# Patient Record
Sex: Female | Born: 1995 | Race: White | Hispanic: No | Smoking: Never smoker
Health system: Southern US, Community
[De-identification: ages and names within clinical notes are randomized; demographics above are authoritative.]

## PROBLEM LIST (undated history)

## (undated) ENCOUNTER — Inpatient Hospital Stay: Payer: Self-pay

## (undated) DIAGNOSIS — F419 Anxiety disorder, unspecified: Secondary | ICD-10-CM

## (undated) DIAGNOSIS — F32A Depression, unspecified: Secondary | ICD-10-CM

## (undated) DIAGNOSIS — Z789 Other specified health status: Secondary | ICD-10-CM

## (undated) HISTORY — DX: Anxiety disorder, unspecified: F41.9

## (undated) HISTORY — DX: Depression, unspecified: F32.A

## (undated) HISTORY — PX: DENTAL SURGERY: SHX609

---
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
## 2017-07-27 NOTE — L&D Delivery Note (Signed)
Delivery Note Primary OB: [facility] Delivery Physician: Annamarie Major, MD Gestational Age: Full term Antepartum complications: none Intrapartum complications: None  A viable Female was delivered via vertex perentation.  Apgars:9 ,9  Weight:  pending .   Placenta status: spontaneous and Intact.  Cord: 3+ vessels;  with the following complications: none.  Anesthesia:  none Episiotomy:  none Lacerations:  1st Suture Repair: 2.0 vicryl Est. Blood Loss (mL):  less than 500 mL  Mom to postpartum.  Baby to Couplet care / Skin to Skin.  Annamarie Major, MD, [facility], [facility] 06/02/2018  7:01 PM [phone]

## 2017-10-04 ENCOUNTER — Ambulatory Visit (INDEPENDENT_AMBULATORY_CARE_PROVIDER_SITE_OTHER): Payer: Medicaid Other | Admitting: Obstetrics and Gynecology

## 2017-10-04 ENCOUNTER — Encounter: Payer: Self-pay | Admitting: Obstetrics and Gynecology

## 2017-10-04 VITALS — BP 114/78 | Ht 59.0 in | Wt 150.0 lb

## 2017-10-04 DIAGNOSIS — O99210 Obesity complicating pregnancy, unspecified trimester: Secondary | ICD-10-CM | POA: Insufficient documentation

## 2017-10-04 DIAGNOSIS — Z6830 Body mass index (BMI) 30.0-30.9, adult: Secondary | ICD-10-CM

## 2017-10-04 DIAGNOSIS — Z3A08 8 weeks gestation of pregnancy: Secondary | ICD-10-CM

## 2017-10-04 DIAGNOSIS — O99211 Obesity complicating pregnancy, first trimester: Secondary | ICD-10-CM

## 2017-10-04 DIAGNOSIS — O0990 Supervision of high risk pregnancy, unspecified, unspecified trimester: Secondary | ICD-10-CM

## 2017-10-04 NOTE — Progress Notes (Signed)
New Obstetric Patient H&P   Chief Complaint: "Desires prenatal care"   History of Present Illness: Patient is a 22 y.o. G1P0 female, certain LMP 08/03/17 presents with amenorrhea and positive home pregnancy test. Based on her  LMP, her EDD is Estimated Date of Delivery: 05/10/18 and her EGA is 3479w6d. Cycles are variable in lenght, and occur approximately every : 28 days. She has never had a pap smear.    She had a urine pregnancy test which was positive 6 week(s)  ago. Her last menstrual period was normal and lasted for  about 3 weeks. Since her LMP she claims she has experienced no issues. She had vaginal bleeding about 2 weeks ago, very light. Her past medical history is noncontributory. This is her first pregnancy.   Since her LMP, she admits to the use of tobacco products  Quit at positive pregnancy test She claims she has gained 2 pounds since the start of her pregnancy.  There are cats in the home in the home  no  She admits close contact with children on a regular basis  no  She has had chicken pox in the past no. She did receive the vaccination series.  She has had Tuberculosis exposures, symptoms, or previously tested positive for TB   no Current or past history of domestic violence. no  Genetic Screening/Teratology Counseling: (Includes patient, baby's father, or anyone in either family with:)   1. [facility] 84[facility]  no 2. Thalassemia ([place], [place], Mediterranean, or Asian background): MCV<80  no 3. Neural tube defect (meningomyelocele, spina bifida, anencephaly)  no 4. Congenital heart defect  no  5. Down syndrome  no 6. Tay-Sachs (Jewish, [place])  no 7. Canavan's Disease  no 8. Sickle cell disease or trait (African)  no  9. Hemophilia or other blood disorders  no  10. Muscular dystrophy  no  11. Cystic fibrosis  no  12. Huntington's Chorea  no  13. Mental retardation/autism  Yes (father's brother).   14. Other inherited genetic or chromosomal disorder   no 15. Maternal metabolic disorder (DM, PKU, etc)  no 16. Patient or FOB with a child with a birth defect not listed above no  16a. Patient or FOB with a birth defect themselves no 17. Recurrent pregnancy loss, or stillbirth  no  18. Any medications since LMP other than prenatal vitamins (include vitamins, supplements, OTC meds, drugs, alcohol)  no 19. Any other genetic/environmental exposure to discuss  no  Infection History:   1. Lives with someone with TB or TB exposed  no  2. Patient or partner has history of genital herpes  no 3. Rash or viral illness since LMP  no 4. History of STI (GC, CT, HPV, syphilis, HIV)  no 5. History of recent travel :  no  Other pertinent information:  Yes. Patient with history of anxiety. Not on medication right now and states she is doing well now. She also states she has been told that she is bipolar.  But, nowhere else is that diagnosis listed.  Review of Systems: Review of Systems  Constitutional: Negative.   HENT: Negative.   Eyes: Negative.   Respiratory: Negative.   Cardiovascular: Negative.   Gastrointestinal: Negative.   Genitourinary: Negative.   Musculoskeletal: Negative.   Skin: Negative.   Neurological: Negative.   Psychiatric/Behavioral: Negative.     Past Medical History:  denies  Past Surgical History: denies  Gynecologic History: Patient's last menstrual period was 08/03/2017.  Obstetric History:  G1P0  Family History  Problem Relation Age of Onset  . Diabetes Mellitus II Maternal Aunt     Social History   Socioeconomic History  . Marital status: Single    Spouse name: Not on file  . Number of children: Not on file  . Years of education: Not on file  . Highest education level: Not on file  Social Needs  . Financial resource strain: Not on file  . Food insecurity - worry: Not on file  . Food insecurity - inability: Not on file  . Transportation needs - medical: Not on file  . Transportation needs - non-medical:  Not on file  Occupational History  . Not on file  Tobacco Use  . Smoking status: Never Smoker  . Smokeless tobacco: Never Used  Substance and Sexual Activity  . Alcohol use: No    Frequency: Never  . Drug use: No  . Sexual activity: Yes    Birth control/protection: None  Other Topics Concern  . Not on file  Social History Narrative  . Not on file   Allergies: No Known Allergies  Prior to Admission medications   Medication Sig Start Date End Date Taking? Authorizing Provider  Prenatal Vit-Fe Fumarate-FA (PRENATAL VITAMIN PO) Take by mouth.   Yes [provider]    Physical Exam BP 114/78   Ht 4\' 11"  (1.499 m)   Wt 150 lb (68 kg)   LMP 08/03/2017   BMI 30.30 kg/m   Physical Exam  Constitutional: She is oriented to person, place, and time. She appears well-developed and well-nourished. No distress.  HENT:  Head: Normocephalic and atraumatic.  Eyes: Conjunctivae are normal.  Neck: Normal range of motion. Neck supple. No thyromegaly present.  Cardiovascular: Normal rate, regular rhythm and normal heart sounds. Exam reveals no gallop and no friction rub.  No murmur heard. Pulmonary/Chest: Effort normal and breath sounds normal. She has no wheezes.  Abdominal: Soft. She exhibits no distension and no mass. There is no tenderness. There is no rebound and no guarding. No hernia. Hernia confirmed negative in the right inguinal area and confirmed negative in the left inguinal area.  Genitourinary: Vagina normal and uterus normal. Pelvic exam was performed with patient supine. There is no rash, tenderness or lesion on the right labia. There is no rash, tenderness or lesion on the left labia. Cervix exhibits no motion tenderness. Right adnexum displays no mass, no tenderness and no fullness. Left adnexum displays no mass, no tenderness and no fullness.  Musculoskeletal: Normal range of motion.  Lymphadenopathy:    She has no cervical adenopathy.       Right: No inguinal  adenopathy present.       Left: No inguinal adenopathy present.  Neurological: She is alert and oriented to person, place, and time.  Skin: Skin is warm and dry. No rash noted.  Psychiatric: She has a normal mood and affect. Her behavior is normal. Judgment normal.    Female Chaperone present during breast and/or pelvic exam.  Assessment: 22 y.o. G1P0 at [redacted]w[redacted]d presenting to initiate prenatal care  Plan: 1) Avoid alcoholic beverages. 2) Patient encouraged not to smoke.  3) Discontinue the use of all non-medicinal drugs and chemicals.  4) Take prenatal vitamins daily.  5) Nutrition, food safety (fish, cheese advisories, and high nitrite foods) and exercise discussed. 6) Hospital and practice style discussed with cross coverage system.  7) Genetic Screening, such as with 1st Trimester Screening, cell free fetal DNA, AFP testing, and Ultrasound,  as well as with amniocentesis and CVS as appropriate, is discussed with patient. At the conclusion of today's visit patient undecided genetic testing 8) Patient is asked about travel to areas at risk for the [place] virus, and counseled to avoid travel and exposure to mosquitoes or sexual partners who may have themselves been exposed to the virus. Testing is discussed, and will be ordered as appropriate.  [place] samples given.  Thomasene Mohair, MD 10/04/2017 4:24 PM

## 2017-10-07 LAB — URINE CULTURE

## 2017-10-07 LAB — IGP, CTNG, RFX APTIMA HPV ASCU
Chlamydia, Nuc. Acid Amp: NEGATIVE
Gonococcus by Nucleic Acid Amp: NEGATIVE
PAP Smear Comment: 0

## 2017-10-15 ENCOUNTER — Encounter: Payer: Self-pay | Admitting: Advanced Practice Midwife

## 2017-10-15 ENCOUNTER — Ambulatory Visit (INDEPENDENT_AMBULATORY_CARE_PROVIDER_SITE_OTHER): Payer: Medicaid Other

## 2017-10-15 ENCOUNTER — Other Ambulatory Visit: Payer: Medicaid Other

## 2017-10-15 ENCOUNTER — Other Ambulatory Visit: Payer: Self-pay | Admitting: Obstetrics and Gynecology

## 2017-10-15 ENCOUNTER — Ambulatory Visit (INDEPENDENT_AMBULATORY_CARE_PROVIDER_SITE_OTHER): Payer: Medicaid Other | Admitting: Advanced Practice Midwife

## 2017-10-15 VITALS — BP 118/60 | Wt 149.0 lb

## 2017-10-15 DIAGNOSIS — O0990 Supervision of high risk pregnancy, unspecified, unspecified trimester: Secondary | ICD-10-CM

## 2017-10-15 DIAGNOSIS — Z6830 Body mass index (BMI) 30.0-30.9, adult: Secondary | ICD-10-CM

## 2017-10-15 DIAGNOSIS — O99211 Obesity complicating pregnancy, first trimester: Secondary | ICD-10-CM

## 2017-10-15 DIAGNOSIS — Z3A01 Less than 8 weeks gestation of pregnancy: Secondary | ICD-10-CM

## 2017-10-15 NOTE — Progress Notes (Signed)
No [email or web address] 

## 2017-10-15 NOTE — Progress Notes (Signed)
  Routine Prenatal Care Visit  Subjective  Alyssa Duffy is a 22 y.o. G1P0 at 1763w0d being seen today for ongoing prenatal care.  She is currently monitored for the following issues for this high-risk pregnancy and has Supervision of high risk pregnancy, antepartum; Obesity affecting pregnancy; and BMI 30.0-30.9,adult on their problem list.  ----------------------------------------------------------------------------------- Patient reports no complaints.  Patient is undecided on genetic screening.  . Vag. Bleeding: None.   . Denies leaking of fluid.  ----------------------------------------------------------------------------------- The following portions of the patient's history were reviewed and updated as appropriate: allergies, current medications, past family history, past medical history, past social history, past surgical history and problem list. Problem list updated.   Objective  Blood pressure 118/60, weight 149 lb (67.6 kg), last menstrual period 08/03/2017. Pregravid weight 148 lb (67.1 kg) Total Weight Gain 1 lb (0.454 kg) Urinalysis:      Fetal Status: Fetal Heart Rate (bpm): 132         Dating ultrasound today measures 7663w0d. EDD adjusted due to difference in dates.   General:  Alert, oriented and cooperative. Patient is in no acute distress.  Skin: Skin is warm and dry. No rash noted.   Cardiovascular: Normal heart rate noted  Respiratory: Normal respiratory effort, no problems with respiration noted  Abdomen: Soft, gravid, appropriate for gestational age.       Pelvic:  Cervical exam deferred        Extremities: Normal range of motion.     Mental Status: Normal mood and affect. Normal behavior. Normal judgment and thought content.   Assessment   22 y.o. G1P0 at 5363w0d by  06/03/2018, by Ultrasound presenting for routine prenatal visit  Plan   pregnancy Problems (from 10/04/17 to present)    Problem Noted Resolved   Supervision of high risk pregnancy, antepartum  10/04/2017 by Conard NovakJackson, Stephen D, MD No   Obesity affecting pregnancy 10/04/2017 by Conard NovakJackson, Stephen D, MD No   BMI 30.0-30.9,adult 10/04/2017 by Conard NovakJackson, Stephen D, MD No       Preterm labor symptoms and general obstetric precautions including but not limited to vaginal bleeding, contractions, leaking of fluid and fetal movement were reviewed in detail with the patient.   Return in about 1 month (around 11/12/2017) for rob.  Tresea MallJane Linzey Ramser, CNM 10/15/2017 11:08 AM

## 2017-10-18 LAB — RPR+RH+ABO+RUB AB+AB SCR+CB...
Antibody Screen: NEGATIVE
HEMOGLOBIN: 13.4 g/dL (ref 11.1–15.9)
HEP B S AG: NEGATIVE
HIV SCREEN 4TH GENERATION: NONREACTIVE
Hematocrit: 41 % (ref 34.0–46.6)
MCH: 29.1 pg (ref 26.6–33.0)
MCHC: 32.7 g/dL (ref 31.5–35.7)
MCV: 89 fL (ref 79–97)
Platelets: 264 10*3/uL (ref 150–379)
RBC: 4.61 x10E6/uL (ref 3.77–5.28)
RDW: 14.2 % (ref 12.3–15.4)
RPR Ser Ql: NONREACTIVE
Rh Factor: POSITIVE
Rubella Antibodies, IGG: <0.9 index — ABNORMAL LOW (ref >0.99)
WBC: 9.3 10*3/uL (ref 3.4–10.8)

## 2017-10-18 LAB — GLUCOSE, 1 HOUR GESTATIONAL: GESTATIONAL DIABETES SCREEN: 88 mg/dL (ref 65–139)

## 2017-10-18 LAB — HEMOGLOBINOPATHY EVALUATION
HGB C: 0 %
HGB S: 0 %
HGB VARIANT: 0 %
Hemoglobin A2 Quantitation: 2.5 % (ref 1.8–3.2)
Hemoglobin F Quantitation: 0 % (ref 0.0–2.0)
Hgb A: 97.5 % (ref 96.4–98.8)

## 2017-11-11 ENCOUNTER — Ambulatory Visit (INDEPENDENT_AMBULATORY_CARE_PROVIDER_SITE_OTHER): Payer: Medicaid Other | Admitting: Obstetrics and Gynecology

## 2017-11-11 ENCOUNTER — Encounter: Payer: Self-pay | Admitting: Obstetrics and Gynecology

## 2017-11-11 VITALS — BP 122/70 | Wt 148.0 lb

## 2017-11-11 DIAGNOSIS — O0990 Supervision of high risk pregnancy, unspecified, unspecified trimester: Secondary | ICD-10-CM

## 2017-11-11 DIAGNOSIS — O99211 Obesity complicating pregnancy, first trimester: Secondary | ICD-10-CM

## 2017-11-11 DIAGNOSIS — Z3A10 10 weeks gestation of pregnancy: Secondary | ICD-10-CM

## 2017-11-11 DIAGNOSIS — Z6830 Body mass index (BMI) 30.0-30.9, adult: Secondary | ICD-10-CM

## 2017-11-11 NOTE — Progress Notes (Incomplete)
  Routine Prenatal Care Visit  Subjective  Alyssa Duffy is a 22 y.o. G1P0 at 5926w6d being seen today for ongoing prenatal care.  She is currently monitored for the following issues for this {Blank single:19197::"high-risk","low-risk"} pregnancy and has Supervision of high risk pregnancy, antepartum; Obesity affecting pregnancy; and BMI 30.0-30.9,adult on their problem list.  ----------------------------------------------------------------------------------- Patient reports {sx:14538}.    . Vag. Bleeding: None.   . Denies leaking of fluid.  ----------------------------------------------------------------------------------- The following portions of the patient's history were reviewed and updated as appropriate: allergies, current medications, past family history, past medical history, past social history, past surgical history and problem list. Problem list updated.   Objective  Blood pressure 122/70, weight 148 lb (67.1 kg), last menstrual period 08/03/2017. Pregravid weight 148 lb (67.1 kg) Total Weight Gain 0 lb (0 kg) Urinalysis: Urine Protein: Negative Urine Glucose: Negative  Fetal Status: Fetal Heart Rate (bpm): 170         General:  Alert, oriented and cooperative. Patient is in no acute distress.  Skin: Skin is warm and dry. No rash noted.   Cardiovascular: Normal heart rate noted  Respiratory: Normal respiratory effort, no problems with respiration noted  Abdomen: Soft, gravid, appropriate for gestational age. Pain/Pressure: Absent     Pelvic:  {Blank single:19197::"Cervical exam performed","Cervical exam deferred"}        Extremities: Normal range of motion.     Mental Status: Normal mood and affect. Normal behavior. Normal judgment and thought content.   Assessment   22 y.o. G1P0 at 5726w6d by  06/03/2018, by Ultrasound presenting for {Blank single:19197::"routine","work-in"} prenatal visit  Plan   pregnancy Problems (from 10/04/17 to present)    Problem Noted Resolved   Supervision of high risk pregnancy, antepartum 10/04/2017 by Conard NovakJackson, Giuseppe Duchemin D, MD No   Overview Signed 10/19/2017  3:21 PM by Conard NovakJackson, Alyce Inscore D, MD    Clinic [facility] Prenatal Labs  Dating  Blood type:     Genetic Screen 1 Screen:    AFP:     Quad:     NIPS: Antibody:   Anatomic US  Rubella:   Varicella: @VZVIGG @  GTT Early:  8288              Third trimester:  RPR:     Rhogam  HBsAg:     TDaP vaccine                       Flu Shot: HIV:     Baby Food                                GBS:   Contraception  Pap: NILM  CBB   SS: neg  CS/VBAC    Support Person            Obesity affecting pregnancy 10/04/2017 by Conard NovakJackson, Robbert Langlinais D, MD No   BMI 30.0-30.9,adult 10/04/2017 by Conard NovakJackson, Erynn Vaca D, MD No       {Blank single:19197::"Term","Preterm"} labor symptoms and general obstetric precautions including but not limited to vaginal bleeding, contractions, leaking of fluid and fetal movement were reviewed in detail with the patient. Please refer to After Visit Summary for other counseling recommendations.   Return in about 1 week (around 11/18/2017) for U/S for NT and Routine Prenatal Appointment.  Thomasene MohairStephen Sapna Padron, MD, Merlinda FrederickFACOG [facility], [facility] 11/11/2017 9:43 AM  No vb. No lof.

## 2017-11-11 NOTE — Patient Instructions (Signed)
For nausea (these may be purchased over-the-counter): -Vitamin B6 (pyridoxine):  25 mg three times each day (may buy 100 mg tablet and take twice per day or try to cut into 4 equal pieces and take 1 piece three times each day).  - doxylamine (found in [employer] and other sleep agents that can be bought in the store): take 25 - 50 mg at bedtime.  May take up to 25 mg three time each day.  However, keep in mind that this might make you sleepy.  

## 2017-11-11 NOTE — Progress Notes (Signed)
  Routine Prenatal Care Visit  Subjective  Alyssa Duffy is a 22 y.o. G1P0 at 4269w6d being seen today for ongoing prenatal care.  She is currently monitored for the following issues for this low-risk pregnancy and has Supervision of high risk pregnancy, antepartum; Obesity affecting pregnancy; and BMI 30.0-30.9,adult on their problem list.  ----------------------------------------------------------------------------------- Patient reports no complaints.    . Vag. Bleeding: None.   . Denies leaking of fluid.  ----------------------------------------------------------------------------------- The following portions of the patient's history were reviewed and updated as appropriate: allergies, current medications, past family history, past medical history, past social history, past surgical history and problem list. Problem list updated.   Objective  Blood pressure 122/70, weight 148 lb (67.1 kg), last menstrual period 08/03/2017. Pregravid weight 148 lb (67.1 kg) Total Weight Gain 0 lb (0 kg) Urinalysis: Urine Protein: Negative Urine Glucose: Negative  Fetal Status: Fetal Heart Rate (bpm): 170         General:  Alert, oriented and cooperative. Patient is in no acute distress.  Skin: Skin is warm and dry. No rash noted.   Cardiovascular: Normal heart rate noted  Respiratory: Normal respiratory effort, no problems with respiration noted  Abdomen: Soft, gravid, appropriate for gestational age. Pain/Pressure: Absent     Pelvic:  Cervical exam deferred        Extremities: Normal range of motion.     Mental Status: Normal mood and affect. Normal behavior. Normal judgment and thought content.   Assessment   22 y.o. G1P0 at 269w6d by  06/03/2018, by Ultrasound presenting for routine prenatal visit  Plan   pregnancy Problems (from 10/04/17 to present)    Problem Noted Resolved   Supervision of high risk pregnancy, antepartum 10/04/2017 by Conard NovakJackson, Dijon Cosens D, MD No   Overview Signed 10/19/2017   3:21 PM by Conard NovakJackson, Bonifacio Pruden D, MD    [facility]inic [facility] Prenatal Labs  Dating  Blood type:     Genetic Screen 1 Screen:    AFP:     Quad:     NIPS: Antibody:   Anatomic US  Rubella:   Varicella: @VZVIGG @  GTT Early:  7088              Third trimester:  RPR:     Rhogam  HBsAg:     TDaP vaccine                       Flu Shot: HIV:     Baby Food                                GBS:   Contraception  Pap: NILM  CBB   SS: neg  CS/VBAC    Support Person            Obesity affecting pregnancy 10/04/2017 by Conard NovakJackson, Darien Mignogna D, MD No   BMI 30.0-30.9,adult 10/04/2017 by Conard NovakJackson, Shondell Poulson D, MD No       Preterm labor symptoms and general obstetric precautions including but not limited to vaginal bleeding, contractions, leaking of fluid and fetal movement were reviewed in detail with the patient. Please refer to After Visit Summary for other counseling recommendations.   Return in about 1 month (around 12/09/2017) for Routine Prenatal Appointment.  Thomasene MohairStephen Jeffie Widdowson, MD, Merlinda FrederickFACOG [facility], [facility] 11/11/2017 9:45 AM

## 2017-12-09 ENCOUNTER — Ambulatory Visit (INDEPENDENT_AMBULATORY_CARE_PROVIDER_SITE_OTHER): Payer: Medicaid Other | Admitting: Obstetrics and Gynecology

## 2017-12-09 ENCOUNTER — Encounter: Payer: Self-pay | Admitting: Obstetrics and Gynecology

## 2017-12-09 VITALS — BP 110/60 | Wt 145.0 lb

## 2017-12-09 DIAGNOSIS — O99212 Obesity complicating pregnancy, second trimester: Secondary | ICD-10-CM

## 2017-12-09 DIAGNOSIS — Z3A14 14 weeks gestation of pregnancy: Secondary | ICD-10-CM

## 2017-12-09 DIAGNOSIS — Z6830 Body mass index (BMI) 30.0-30.9, adult: Secondary | ICD-10-CM

## 2017-12-09 DIAGNOSIS — O0990 Supervision of high risk pregnancy, unspecified, unspecified trimester: Secondary | ICD-10-CM

## 2017-12-09 NOTE — Progress Notes (Signed)
  Routine Prenatal Care Visit  Subjective  Alyssa Duffy is a 22 y.o. G1P0 at [redacted]w[redacted]d being seen today for ongoing prenatal care.  She is currently monitored for the following issues for this low-risk pregnancy and has Supervision of high risk pregnancy, antepartum; Obesity affecting pregnancy; and BMI 30.0-30.9,adult on their problem list.  ----------------------------------------------------------------------------------- Patient reports no complaints.    . Vag. Bleeding: None.   . Denies leaking of fluid.  ----------------------------------------------------------------------------------- The following portions of the patient's history were reviewed and updated as appropriate: allergies, current medications, past family history, past medical history, past social history, past surgical history and problem list. Problem list updated.   Objective  Blood pressure 110/60, weight 145 lb (65.8 kg), last menstrual period 08/03/2017. Pregravid weight 148 lb (67.1 kg) Total Weight Gain -3 lb (-1.361 kg) Urinalysis: Urine Protein: Negative Urine Glucose: Negative  Fetal Status: Fetal Heart Rate (bpm): 155         General:  Alert, oriented and cooperative. Patient is in no acute distress.  Skin: Skin is warm and dry. No rash noted.   Cardiovascular: Normal heart rate noted  Respiratory: Normal respiratory effort, no problems with respiration noted  Abdomen: Soft, gravid, appropriate for gestational age. Pain/Pressure: Absent     Pelvic:  Cervical exam deferred        Extremities: Normal range of motion.     Mental Status: Normal mood and affect. Normal behavior. Normal judgment and thought content.   Assessment   22 y.o. G1P0 at [redacted]w[redacted]d by  06/03/2018, by Ultrasound presenting for routine prenatal visit  Plan   pregnancy Problems (from 10/04/17 to present)    Problem Noted Resolved   Supervision of high risk pregnancy, antepartum 10/04/2017 by Conard Novak, MD No   Overview Signed  10/19/2017  3:21 PM by Conard Novak, MD    [facility]inic [facility] Prenatal Labs  Dating  Blood type:     Genetic Screen 1 Screen:    AFP:     Quad:     NIPS: Antibody:   Anatomic [place]  Rubella:   Varicella: @  GTT Early:  60              Third trimester:  RPR:     Rhogam  HBsAg:     TDaP vaccine                       Flu Shot: HIV:     Baby Food                                GBS:   Contraception  Pap: NILM  CBB   SS: neg  CS/VBAC    Support Person            Obesity affecting pregnancy 10/04/2017 by Conard Novak, MD No   BMI 30.0-30.9,adult 10/04/2017 by Conard Novak, MD No       Preterm labor symptoms and general obstetric precautions including but not limited to vaginal bleeding, contractions, leaking of fluid and fetal movement were reviewed in detail with the patient. Please refer to After Visit Summary for other counseling recommendations.   -declines genetic screening again today.   Return in about 1 month (around 01/06/2018) for schedule u/s for anatomy and routine prenatal after.  Thomasene Mohair, MD, [facility], [facility] 12/09/2017 11:36 AM

## 2017-12-09 NOTE — Patient Instructions (Signed)
Second Trimester of Pregnancy The second trimester is from week 13 through week 28, month 4 through 6. This is often the time in pregnancy that you feel your best. Often times, morning sickness has lessened or quit. You may have more energy, and you may get hungry more often. Your unborn baby (fetus) is growing rapidly. At the end of the sixth month, he or she is about 9 inches long and weighs about 1 pounds. You will likely feel the baby move (quickening) between 18 and 20 weeks of pregnancy. Follow these instructions at home:  Avoid all smoking, herbs, and alcohol. Avoid drugs not approved by your doctor.  Do not use any tobacco products, including cigarettes, chewing tobacco, and electronic cigarettes. If you need help quitting, ask your doctor. You may get counseling or other support to help you quit.  Only take medicine as told by your doctor. Some medicines are safe and some are not during pregnancy.  Exercise only as told by your doctor. Stop exercising if you start having cramps.  Eat regular, healthy meals.  Wear a good support bra if your breasts are tender.  Do not use hot tubs, steam rooms, or saunas.  Wear your seat belt when driving.  Avoid raw meat, uncooked cheese, and liter boxes and soil used by cats.  Take your prenatal vitamins.  Take 1500-2000 milligrams of calcium daily starting at the 20th week of pregnancy until you deliver your baby.  Try taking medicine that helps you poop (stool softener) as needed, and if your doctor approves. Eat more fiber by eating fresh fruit, vegetables, and whole grains. Drink enough fluids to keep your pee (urine) clear or pale yellow.  Take warm water baths (sitz baths) to soothe pain or discomfort caused by hemorrhoids. Use hemorrhoid cream if your doctor approves.  If you have puffy, bulging veins (varicose veins), wear support hose. Raise (elevate) your feet for 15 minutes, 3-4 times a day. Limit salt in your diet.  Avoid heavy  lifting, wear low heals, and sit up straight.  Rest with your legs raised if you have leg cramps or low back pain.  Visit your dentist if you have not gone during your pregnancy. Use a soft toothbrush to brush your teeth. Be gentle when you floss.  You can have sex (intercourse) unless your doctor tells you not to.  Go to your doctor visits. Get help if:  You feel dizzy.  You have mild cramps or pressure in your lower belly (abdomen).  You have a nagging pain in your belly area.  You continue to feel sick to your stomach (nauseous), throw up (vomit), or have watery poop (diarrhea).  You have bad smelling fluid coming from your vagina.  You have pain with peeing (urination). Get help right away if:  You have a fever.  You are leaking fluid from your vagina.  You have spotting or bleeding from your vagina.  You have severe belly cramping or pain.  You lose or gain weight rapidly.  You have trouble catching your breath and have chest pain.  You notice sudden or extreme puffiness (swelling) of your face, hands, ankles, feet, or legs.  You have not felt the baby move in over an hour.  You have severe headaches that do not go away with medicine.  You have vision changes. This information is not intended to replace advice given to you by your health care provider. Make sure you discuss any questions you have with your health care   provider. Document Released: 10/07/2009 Document Revised: 12/19/2015 Document Reviewed: 09/13/2012 Elsevier Interactive Patient Education  2017 [employer]  

## 2017-12-09 NOTE — Progress Notes (Incomplete)
  Routine Prenatal Care Visit  Subjective  Alyssa Duffy is a 22 y.o. G1P0 at [redacted]w[redacted]d being seen today for ongoing prenatal care.  She is currently monitored for the following issues for this {Blank single:19197::"high-risk","low-risk"} pregnancy and has Supervision of high risk pregnancy, antepartum; Obesity affecting pregnancy; and BMI 30.0-30.9,adult on their problem list.  ----------------------------------------------------------------------------------- Patient reports {sx:14538}.    . Vag. Bleeding: None.   . Denies leaking of fluid.  ----------------------------------------------------------------------------------- The following portions of the patient's history were reviewed and updated as appropriate: allergies, current medications, past family history, past medical history, past social history, past surgical history and problem list. Problem list updated.   Objective  Blood pressure 110/60, weight 145 lb (65.8 kg), last menstrual period 08/03/2017. Pregravid weight 148 lb (67.1 kg) Total Weight Gain -3 lb (-1.361 kg) Urinalysis: Urine Protein: Negative Urine Glucose: Negative  Fetal Status: Fetal Heart Rate (bpm): 155         General:  Alert, oriented and cooperative. Patient is in no acute distress.  Skin: Skin is warm and dry. No rash noted.   Cardiovascular: Normal heart rate noted  Respiratory: Normal respiratory effort, no problems with respiration noted  Abdomen: Soft, gravid, appropriate for gestational age. Pain/Pressure: Absent     Pelvic:  {Blank single:19197::"Cervical exam performed","Cervical exam deferred"}        Extremities: Normal range of motion.     Mental Status: Normal mood and affect. Normal behavior. Normal judgment and thought content.   Assessment   22 y.o. G1P0 at [redacted]w[redacted]d by  06/03/2018, by Ultrasound presenting for {Blank single:19197::"routine","work-in"} prenatal visit  Plan   pregnancy Problems (from 10/04/17 to present)    Problem Noted  Resolved   Supervision of high risk pregnancy, antepartum 10/04/2017 by Conard Novak, MD No   Overview Signed 10/19/2017  3:21 PM by Conard Novak, MD    Clinic [facility] Prenatal Labs  Dating  Blood type:     Genetic Screen 1 Screen:    AFP:     Quad:     NIPS: Antibody:   Anatomic [place]  Rubella:   Varicella: @  GTT Early:  11              Third trimester:  RPR:     Rhogam  HBsAg:     TDaP vaccine                       Flu Shot: HIV:     Baby Food                                GBS:   Contraception  Pap: NILM  CBB   SS: neg  CS/VBAC    Support Person            Obesity affecting pregnancy 10/04/2017 by Conard Novak, MD No   BMI 30.0-30.9,adult 10/04/2017 by Conard Novak, MD No       {Blank single:19197::"Term","Preterm"} labor symptoms and general obstetric precautions including but not limited to vaginal bleeding, contractions, leaking of fluid and fetal movement were reviewed in detail with the patient. Please refer to After Visit Summary for other counseling recommendations.   Return in about 1 month (around 01/06/2018) for schedule u/s for anatomy and routine prenatal after.  Thomasene Mohair, MD, [facility], [facility] 12/09/2017 11:36 AM

## 2018-01-07 ENCOUNTER — Ambulatory Visit (INDEPENDENT_AMBULATORY_CARE_PROVIDER_SITE_OTHER): Payer: Medicaid Other

## 2018-01-07 ENCOUNTER — Encounter: Payer: Self-pay | Admitting: Obstetrics and Gynecology

## 2018-01-07 ENCOUNTER — Ambulatory Visit (INDEPENDENT_AMBULATORY_CARE_PROVIDER_SITE_OTHER): Payer: Medicaid Other | Admitting: Obstetrics and Gynecology

## 2018-01-07 VITALS — BP 110/60 | Wt 150.0 lb

## 2018-01-07 DIAGNOSIS — O99213 Obesity complicating pregnancy, third trimester: Secondary | ICD-10-CM

## 2018-01-07 DIAGNOSIS — O99212 Obesity complicating pregnancy, second trimester: Secondary | ICD-10-CM

## 2018-01-07 DIAGNOSIS — O0993 Supervision of high risk pregnancy, unspecified, third trimester: Secondary | ICD-10-CM

## 2018-01-07 DIAGNOSIS — O4440 Low lying placenta NOS or without hemorrhage, unspecified trimester: Secondary | ICD-10-CM

## 2018-01-07 DIAGNOSIS — O4443 Low lying placenta NOS or without hemorrhage, third trimester: Secondary | ICD-10-CM | POA: Diagnosis not present

## 2018-01-07 DIAGNOSIS — Z6830 Body mass index (BMI) 30.0-30.9, adult: Secondary | ICD-10-CM

## 2018-01-07 DIAGNOSIS — O0990 Supervision of high risk pregnancy, unspecified, unspecified trimester: Secondary | ICD-10-CM

## 2018-01-07 DIAGNOSIS — Z3A19 19 weeks gestation of pregnancy: Secondary | ICD-10-CM

## 2018-01-07 NOTE — Progress Notes (Signed)
Routine Prenatal Care Visit  Subjective  Alyssa Duffy is a 22 y.o. G1P0 at [redacted]w[redacted]d being seen today for ongoing prenatal care.  She is currently monitored for the following issues for this low-risk pregnancy and has Supervision of high risk pregnancy, antepartum; Obesity affecting pregnancy; and BMI 30.0-30.9,adult on their problem list.  ----------------------------------------------------------------------------------- Patient reports some anxiety-like symptoms for the past week while lying on her back before bed (racing heart, hot, trouble breathing). Resolves after position change.     . Vag. Bleeding: None.  Movement: Absent. Denies leaking of fluid.  Anatomy screen incomplete today.  Anterior marginal previa (low-lying placenta at 1.2 cm from anterior edge) ----------------------------------------------------------------------------------- The following portions of the patient's history were reviewed and updated as appropriate: allergies, current medications, past family history, past medical history, past social history, past surgical history and problem list. Problem list updated.  Objective  Blood pressure 110/60, weight 150 lb (68 kg), last menstrual period 08/03/2017. Pregravid weight 148 lb (67.1 kg) Total Weight Gain 2 lb (0.907 kg) Urinalysis: Urine Protein: Negative Urine Glucose: Negative  Fetal Status: Fetal Heart Rate (bpm): present   Movement: Absent     General:  Alert, oriented and cooperative. Patient is in no acute distress.  Skin: Skin is warm and dry. No rash noted.   Cardiovascular: Normal heart rate noted  Respiratory: Normal respiratory effort, no problems with respiration noted  Abdomen: Soft, gravid, appropriate for gestational age. Pain/Pressure: Absent     Pelvic:  Cervical exam deferred        Extremities: Normal range of motion.     Mental Status: Normal mood and affect. Normal behavior. Normal judgment and thought content.   US Ob Comp + 14  Wk  Result Date: 01/07/2018 ULTRASOUND REPORT Patient Name: Alyssa Duffy DOB: May 18, 1996 MRN: [id number] Location: [facility] Date of Service: 01/07/2018 Indications:Anatomy U/S Findings: Mason Jim intrauterine pregnancy is visualized with FHR at 165 BPM. Biometrics give an (U/S) Gestational age of [redacted]w[redacted]d and an (U/S) EDD of 06/02/2018; this correlates with the clinically established Estimated Date of Delivery: 06/03/18 Fetal presentation is transverse. EFW: 11 oz. Placenta: Anterior, grade 0, marginal previa (1.18cm from internal cervical os). AFI: subjectively normal. Anatomic survey is incomplete for cardiac views and diaphragm due to Transverse position  and normal; Gender - surprise.  Right Ovary is normal in appearance. Left Ovary is normal appearance. Survey of the adnexa demonstrates no adnexal masses. There is no free peritoneal fluid in the cul de sac. Impression: 1. [redacted]w[redacted]d Viable Singleton Intrauterine pregnancy by U/S. 2. (U/S) EDD is consistent with Clinically established Estimated Date of Delivery: 06/03/18 . 3. Normal Anatomy Scan Recommendations: 1.Clinical correlation with the patient's History and Physical Exam. 2. Follow up in 4 weeks for completion of Anatomic Survey and in 9 weeks (at 28 weeks) for placentation. Willette Alma, RDMS, RVT There is a singleton gestation with subjectively normal amniotic fluid volume. The fetal biometry correlates with established dating. Detailed evaluation of the fetal anatomy was performed.The fetal anatomical survey appears within normal limits within the resolution of ultrasound as described above.  It must be noted that a normal ultrasound is unable to rule out fetal aneuploidy nor is it able to detect all possible malformations.   The ultrasound images and findings were reviewed by me and I agree with the above report. Thomasene Mohair, MD, [facility], [facility] 01/07/2018 10:42 AM    Assessment   22 y.o. G1P0 at [redacted]w[redacted]d by   06/03/2018,  by Ultrasound presenting for routine prenatal visit  Plan   pregnancy Problems (from 10/04/17 to present)    Problem Noted Resolved   Low-lying placenta 01/07/2018 by Conard NovakJackson, Stephen D, MD No   Overview Signed 01/07/2018 10:44 AM by Conard NovakJackson, Stephen D, MD    [ ]  f/u at 28 weeks      Supervision of high risk pregnancy, antepartum 10/04/2017 by Conard NovakJackson, Stephen D, MD No   Overview Signed 10/19/2017  3:21 PM by Conard NovakJackson, Stephen D, MD    [facility]inic [facility] Prenatal Labs  Dating  Blood type:     Genetic Screen 1 Screen:    AFP:     Quad:     NIPS: Antibody:   Anatomic US  Rubella:   Varicella: @VZVIGG @  GTT Early:  8388              Third trimester:  RPR:     Rhogam  HBsAg:     TDaP vaccine                       Flu Shot: HIV:     Baby Food                                GBS:   Contraception  Pap: NILM  CBB   SS: neg  CS/VBAC    Support Person            Obesity affecting pregnancy 10/04/2017 by Conard NovakJackson, Stephen D, MD No   BMI 30.0-30.9,adult 10/04/2017 by Conard NovakJackson, Stephen D, MD No       Preterm labor symptoms and general obstetric precautions including but not limited to vaginal bleeding, contractions, leaking of fluid and fetal movement were reviewed in detail with the patient. Please refer to After Visit Summary for other counseling recommendations.   Discussed caval compression syndrome, which is what her bed-time symptoms appear to be. Discussed red-flag symptoms and to call immediately, should they occur.   Return in about 1 month (around 02/04/2018) for schedule anatomy completion u/s and routine prenatal.  Thomasene MohairStephen Jackson, MD, [facility]inda FrederickFACOG [facility], [facility] 01/07/2018 10:42 AM

## 2018-01-07 NOTE — Patient Instructions (Signed)
Second Trimester of Pregnancy The second trimester is from week 13 through week 28, month 4 through 6. This is often the time in pregnancy that you feel your best. Often times, morning sickness has lessened or quit. You may have more energy, and you may get hungry more often. Your unborn baby (fetus) is growing rapidly. At the end of the sixth month, he or she is about 9 inches long and weighs about 1 pounds. You will likely feel the baby move (quickening) between 18 and 20 weeks of pregnancy. Follow these instructions at home:  Avoid all smoking, herbs, and alcohol. Avoid drugs not approved by your doctor.  Do not use any tobacco products, including cigarettes, chewing tobacco, and electronic cigarettes. If you need help quitting, ask your doctor. You may get counseling or other support to help you quit.  Only take medicine as told by your doctor. Some medicines are safe and some are not during pregnancy.  Exercise only as told by your doctor. Stop exercising if you start having cramps.  Eat regular, healthy meals.  Wear a good support bra if your breasts are tender.  Do not use hot tubs, steam rooms, or saunas.  Wear your seat belt when driving.  Avoid raw meat, uncooked cheese, and liter boxes and soil used by cats.  Take your prenatal vitamins.  Take 1500-2000 milligrams of calcium daily starting at the 20th week of pregnancy until you deliver your baby.  Try taking medicine that helps you poop (stool softener) as needed, and if your doctor approves. Eat more fiber by eating fresh fruit, vegetables, and whole grains. Drink enough fluids to keep your pee (urine) clear or pale yellow.  Take warm water baths (sitz baths) to soothe pain or discomfort caused by hemorrhoids. Use hemorrhoid cream if your doctor approves.  If you have puffy, bulging veins (varicose veins), wear support hose. Raise (elevate) your feet for 15 minutes, 3-4 times a day. Limit salt in your diet.  Avoid heavy  lifting, wear low heals, and sit up straight.  Rest with your legs raised if you have leg cramps or low back pain.  Visit your dentist if you have not gone during your pregnancy. Use a soft toothbrush to brush your teeth. Be gentle when you floss.  You can have sex (intercourse) unless your doctor tells you not to.  Go to your doctor visits. Get help if:  You feel dizzy.  You have mild cramps or pressure in your lower belly (abdomen).  You have a nagging pain in your belly area.  You continue to feel sick to your stomach (nauseous), throw up (vomit), or have watery poop (diarrhea).  You have bad smelling fluid coming from your vagina.  You have pain with peeing (urination). Get help right away if:  You have a fever.  You are leaking fluid from your vagina.  You have spotting or bleeding from your vagina.  You have severe belly cramping or pain.  You lose or gain weight rapidly.  You have trouble catching your breath and have chest pain.  You notice sudden or extreme puffiness (swelling) of your face, hands, ankles, feet, or legs.  You have not felt the baby move in over an hour.  You have severe headaches that do not go away with medicine.  You have vision changes. This information is not intended to replace advice given to you by your health care provider. Make sure you discuss any questions you have with your health care   provider. Document Released: 10/07/2009 Document Revised: 12/19/2015 Document Reviewed: 09/13/2012 Elsevier Interactive Patient Education  2017 [employer]  

## 2018-02-01 ENCOUNTER — Ambulatory Visit (INDEPENDENT_AMBULATORY_CARE_PROVIDER_SITE_OTHER): Payer: Medicaid Other | Admitting: Obstetrics and Gynecology

## 2018-02-01 ENCOUNTER — Encounter: Payer: Self-pay | Admitting: Obstetrics and Gynecology

## 2018-02-01 ENCOUNTER — Ambulatory Visit (INDEPENDENT_AMBULATORY_CARE_PROVIDER_SITE_OTHER): Payer: Medicaid Other

## 2018-02-01 VITALS — BP 108/60 | Wt 150.0 lb

## 2018-02-01 DIAGNOSIS — Z113 Encounter for screening for infections with a predominantly sexual mode of transmission: Secondary | ICD-10-CM

## 2018-02-01 DIAGNOSIS — O4440 Low lying placenta NOS or without hemorrhage, unspecified trimester: Secondary | ICD-10-CM

## 2018-02-01 DIAGNOSIS — Z3A22 22 weeks gestation of pregnancy: Secondary | ICD-10-CM

## 2018-02-01 DIAGNOSIS — O99212 Obesity complicating pregnancy, second trimester: Secondary | ICD-10-CM

## 2018-02-01 DIAGNOSIS — Z3402 Encounter for supervision of normal first pregnancy, second trimester: Secondary | ICD-10-CM

## 2018-02-01 DIAGNOSIS — O4442 Low lying placenta NOS or without hemorrhage, second trimester: Secondary | ICD-10-CM

## 2018-02-01 DIAGNOSIS — O0990 Supervision of high risk pregnancy, unspecified, unspecified trimester: Secondary | ICD-10-CM

## 2018-02-01 DIAGNOSIS — Z131 Encounter for screening for diabetes mellitus: Secondary | ICD-10-CM

## 2018-02-01 DIAGNOSIS — Z6830 Body mass index (BMI) 30.0-30.9, adult: Secondary | ICD-10-CM

## 2018-02-01 NOTE — Progress Notes (Incomplete)
Routine Prenatal Care Visit  Subjective  Alyssa Duffy is a 22 y.o. G1P0 at [redacted]w[redacted]d being seen today for ongoing prenatal care.  She is currently monitored for the following issues for this {Blank single:19197::"high-risk","low-risk"} pregnancy and has Supervision of high risk pregnancy, antepartum; Obesity affecting pregnancy; BMI 30.0-30.9,adult; and Low-lying placenta on their problem list.  ----------------------------------------------------------------------------------- Patient reports {sx:14538}.    . Vag. Bleeding: None.  Movement: Present. Denies leaking of fluid.  ----------------------------------------------------------------------------------- The following portions of the patient's history were reviewed and updated as appropriate: allergies, current medications, past family history, past medical history, past social history, past surgical history and problem list. Problem list updated.   Objective  Blood pressure 108/60, weight 150 lb (68 kg), last menstrual period 08/03/2017. Pregravid weight 148 lb (67.1 kg) Total Weight Gain 2 lb (0.907 kg) Urinalysis: Urine Protein: Negative Urine Glucose: Negative  Fetal Status: Fetal Heart Rate (bpm): 160   Movement: Present     General:  Alert, oriented and cooperative. Patient is in no acute distress.  Skin: Skin is warm and dry. No rash noted.   Cardiovascular: Normal heart rate noted  Respiratory: Normal respiratory effort, no problems with respiration noted  Abdomen: Soft, gravid, appropriate for gestational age. Pain/Pressure: Absent     Pelvic:  {Blank single:19197::"Cervical exam performed","Cervical exam deferred"}        Extremities: Normal range of motion.     Mental Status: Normal mood and affect. Normal behavior. Normal judgment and thought content.   Assessment   22 y.o. G1P0 at [redacted]w[redacted]d by  06/03/2018, by Ultrasound presenting for {Blank single:19197::"routine","work-in"} prenatal visit  Plan   pregnancy Problems  (from 10/04/17 to present)    Problem Noted Resolved   Low-lying placenta 01/07/2018 by Conard Novak, MD No   Overview Signed 01/07/2018 10:44 AM by Conard Novak, MD    [ ]  f/u at 28 weeks      Supervision of high risk pregnancy, antepartum 10/04/2017 by Conard Novak, MD No   Overview Signed 10/19/2017  3:21 PM by Conard Novak, MD    [facility]inic [facility] Prenatal Labs  Dating  Blood type:     Genetic Screen 1 Screen:    AFP:     Quad:     NIPS: Antibody:   Anatomic [place]  Rubella:   Varicella: @VZVIGG @  GTT Early:  20              Third trimester:  RPR:     Rhogam  HBsAg:     TDaP vaccine                       Flu Shot: HIV:     Baby Food                                GBS:   Contraception  Pap: NILM  CBB   SS: neg  CS/VBAC    Support Person            Obesity affecting pregnancy 10/04/2017 by Conard Novak, MD No   BMI 30.0-30.9,adult 10/04/2017 by Conard Novak, MD No       {Blank single:19197::"Term","Preterm"} labor symptoms and general obstetric precautions including but not limited to vaginal bleeding, contractions, leaking of fluid and fetal movement were reviewed in detail with the patient. Please refer to After Visit Summary for other counseling recommendations.   Return in about  1 month (around 03/01/2018) for schedule u/s for placentation, 28 week labs and routine prenatal.  Thomasene MohairStephen Jackson, MD, [facility]inda FrederickFACOG [facility], [facility] 02/01/2018 11:21 AM

## 2018-02-01 NOTE — Progress Notes (Signed)
Routine Prenatal Care Visit  Subjective  Alyssa Duffy is a 22 y.o. G1P0 at 6265w4d being seen today for ongoing prenatal care.  She is currently monitEstella Huskored for the following issues for this high-risk pregnancy and has Supervision of high risk pregnancy, antepartum; Obesity affecting pregnancy; BMI 30.0-30.9,adult; and Low-lying placenta on their problem list.  ----------------------------------------------------------------------------------- Patient reports no complaints.    . Vag. Bleeding: None.  Movement: Present. Denies leaking of fluid.  Anatomy u/s complete. No measurement made of placentation.  ----------------------------------------------------------------------------------- The following portions of the patient's history were reviewed and updated as appropriate: allergies, current medications, past family history, past medical history, past social history, past surgical history and problem list. Problem list updated.   Objective  Blood pressure 108/60, weight 150 lb (68 kg), last menstrual period 08/03/2017. Pregravid weight 148 lb (67.1 kg) Total Weight Gain 2 lb (0.907 kg) Urinalysis: Urine Protein: Negative Urine Glucose: Negative  Fetal Status: Fetal Heart Rate (bpm): 160   Movement: Present     General:  Alert, oriented and cooperative. Patient is in no acute distress.  Skin: Skin is warm and dry. No rash noted.   Cardiovascular: Normal heart rate noted  Respiratory: Normal respiratory effort, no problems with respiration noted  Abdomen: Soft, gravid, appropriate for gestational age. Pain/Pressure: Absent     Pelvic:  Cervical exam deferred        Extremities: Normal range of motion.     Mental Status: Normal mood and affect. Normal behavior. Normal judgment and thought content.   [place] Ob Follow Up  Result Date: 02/01/2018 ULTRASOUND REPORT Location: [facility] Date of Service: 02/01/2018 Patient Name: Alyssa Duffy DOB: [birth year] MRN: [id number] Indications: Anatomy  follow up ultrasound Findings: Mason JimSingleton intrauterine pregnancy is visualized with FHR at 160 BPM. Biometrics give an (U/S) Gestational age of 4565w4d and an (U/S) EDD of 06/03/2018; this correlates with the clinically established EDD of Estimated Date of Delivery: 06/03/18. Fetal presentation is Cephalic. Placenta: Anterior, Grade 0. AFI: subjectively normal. Anatomic survey is complete. Survey of the adnexa demonstrates no adnexal masses. There is no free peritoneal fluid in the cul de sac. Impression: 1. 2965w4d Viable Singleton Intrauterine pregnancy previously established criteria. 2. Normal Anatomy Scan is now complete Recommendations: 1.Clinical correlation with the patient's History and Physical Exam. 2. Follow up for placenta location in 4 weeks Mital bahen P Patel, RDMS There is a singleton gestation with subjectively normal amniotic fluid volume. The fetal biometry correlates with established dating. Detailed evaluation of the fetal anatomy was performed.The fetal anatomical survey appears within normal limits within the resolution of ultrasound as described above.  Not all anatomy was attempted to be visualized today as this was a follow up anatomy ultrasound.  It must be noted that a normal ultrasound is unable to rule out fetal aneuploidy nor is it able to detect all possible malformations.   The ultrasound images and findings were reviewed by me and I agree with the above report. Thomasene MohairStephen Jerome Otter, MD, Merlinda FrederickFACOG [facility], [facility] 02/01/2018 11:11 AM    Assessment   22 y.o. G1P0 at 5165w4d by  06/03/2018, by Ultrasound presenting for routine prenatal visit  Plan   pregnancy Problems (from 10/04/17 to present)    Problem Noted Resolved   Low-lying placenta 01/07/2018 by Conard NovakJackson, Pascale Maves D, MD No   Overview Signed 01/07/2018 10:44 AM by Conard NovakJackson, Regie Bunner D, MD    [ ]  f/u at 28 weeks      Supervision of high  risk pregnancy, antepartum 10/04/2017 by Conard Novak, MD No   Overview  Signed 10/19/2017  3:21 PM by Conard Novak, MD    [facility]inic [facility] Prenatal Labs  Dating  Blood type:     Genetic Screen 1 Screen:    AFP:     Quad:     NIPS: Antibody:   Anatomic [place]  Rubella:   Varicella: @VZVIGG @  GTT Early:  57              Third trimester:  RPR:     Rhogam  HBsAg:     TDaP vaccine                       Flu Shot: HIV:     Baby Food                                GBS:   Contraception  Pap: NILM  CBB   SS: neg  CS/VBAC    Support Person            Obesity affecting pregnancy 10/04/2017 by Conard Novak, MD No   BMI 30.0-30.9,adult 10/04/2017 by Conard Novak, MD No       Preterm labor symptoms and general obstetric precautions including but not limited to vaginal bleeding, contractions, leaking of fluid and fetal movement were reviewed in detail with the patient. Please refer to After Visit Summary for other counseling recommendations.   Return in about 1 month (around 03/01/2018) for schedule u/s for placentation, 28 week labs and routine prenatal.  Thomasene Mohair, MD, [facility], [facility] 02/01/2018 11:21 AM

## 2018-03-08 ENCOUNTER — Ambulatory Visit (INDEPENDENT_AMBULATORY_CARE_PROVIDER_SITE_OTHER): Payer: Medicaid Other | Admitting: Obstetrics and Gynecology

## 2018-03-08 ENCOUNTER — Ambulatory Visit (INDEPENDENT_AMBULATORY_CARE_PROVIDER_SITE_OTHER): Payer: Medicaid Other

## 2018-03-08 ENCOUNTER — Other Ambulatory Visit: Payer: Self-pay | Admitting: Obstetrics and Gynecology

## 2018-03-08 ENCOUNTER — Other Ambulatory Visit: Payer: Medicaid Other

## 2018-03-08 ENCOUNTER — Encounter: Payer: Self-pay | Admitting: Obstetrics and Gynecology

## 2018-03-08 VITALS — BP 112/58 | Wt 156.0 lb

## 2018-03-08 DIAGNOSIS — Z3A27 27 weeks gestation of pregnancy: Secondary | ICD-10-CM

## 2018-03-08 DIAGNOSIS — O99212 Obesity complicating pregnancy, second trimester: Secondary | ICD-10-CM

## 2018-03-08 DIAGNOSIS — O4442 Low lying placenta NOS or without hemorrhage, second trimester: Secondary | ICD-10-CM | POA: Diagnosis not present

## 2018-03-08 DIAGNOSIS — O4440 Low lying placenta NOS or without hemorrhage, unspecified trimester: Secondary | ICD-10-CM

## 2018-03-08 DIAGNOSIS — Z6830 Body mass index (BMI) 30.0-30.9, adult: Secondary | ICD-10-CM

## 2018-03-08 DIAGNOSIS — O0990 Supervision of high risk pregnancy, unspecified, unspecified trimester: Secondary | ICD-10-CM

## 2018-03-08 DIAGNOSIS — O99213 Obesity complicating pregnancy, third trimester: Secondary | ICD-10-CM

## 2018-03-08 LAB — POCT URINALYSIS DIPSTICK OB
GLUCOSE, UA: NEGATIVE — AB
POC,PROTEIN,UA: NEGATIVE

## 2018-03-08 NOTE — Progress Notes (Signed)
Routine Prenatal Care Visit  Subjective  Alyssa Duffy is a 22 y.o. G1P0 at 73106w4d being seen today for ongoing prenatal care.  She is currently monitored for the following issues for this low-risk pregnancy and has Supervision of high risk pregnancy, antepartum; Obesity affecting pregnancy; BMI 30.0-30.9,adult; and Low-lying placenta on their problem list.  ----------------------------------------------------------------------------------- Patient reports no complaints.   Contractions: Not present. Vag. Bleeding: None.  Movement: Present. Denies leaking of fluid.  Ultrasound shows placenta > 5 cm from cervix.  ----------------------------------------------------------------------------------- The following portions of the patient's history were reviewed and updated as appropriate: allergies, current medications, past family history, past medical history, past social history, past surgical history and problem list. Problem list updated.  Objective  Blood pressure (!) 112/58, weight 156 lb (70.8 kg), last menstrual period 08/03/2017. Pregravid weight 148 lb (67.1 kg) Total Weight Gain 8 lb (3.629 kg) Urinalysis:      Fetal Status: Fetal Heart Rate (bpm): present   Movement: Present     General:  Alert, oriented and cooperative. Patient is in no acute distress.  Skin: Skin is warm and dry. No rash noted.   Cardiovascular: Normal heart rate noted  Respiratory: Normal respiratory effort, no problems with respiration noted  Abdomen: Soft, gravid, appropriate for gestational age. Pain/Pressure: Absent     Pelvic:  Cervical exam deferred        Extremities: Normal range of motion.  Edema: None  Mental Status: Normal mood and affect. Normal behavior. Normal judgment and thought content.   [place] Ob Follow Up  Result Date: 03/08/2018 ULTRASOUND REPORT Location: [facility] Date of Service: 03/08/2018 Indications: Anatomy follow up ultrasound for placentation Findings: Mason JimSingleton intrauterine  pregnancy is visualized with FHR at 142 BPM. Fetal presentation is Cephalic. Placenta: Anterior, grade 0, 4.56cm from internal cervical os. AFI: subjectively normal. Impression: 1. 79106w4d Viable Singleton Intrauterine pregnancy previously established criteria. 2. Anterior Placenta is now 4.56 cm from the internal cervical os Recommendations: 1.Clinical correlation with the patient's History and Physical Exam. Willette AlmaKristen Priestley, RDMS, RVT The ultrasound images and findings were reviewed by me and I agree with the above report. Thomasene MohairStephen Pernell Dikes, MD, Merlinda FrederickFACOG [facility], [facility] 03/08/2018 9:20 AM      Assessment   22 y.o. G1P0 at 49106w4d by  06/03/2018, by Ultrasound presenting for routine prenatal visit  Plan   pregnancy Problems (from 10/04/17 to present)    Problem Noted Resolved   Low-lying placenta 01/07/2018 by Conard NovakJackson, Aby Gessel D, MD No   Overview Addendum 03/08/2018 10:16 AM by Conard NovakJackson, Inayah Woodin D, MD    [x]  f/u at 28 weeks - resolved      Supervision of high risk pregnancy, antepartum 10/04/2017 by Conard NovakJackson, Kamare Caspers D, MD No   Overview Addendum 03/08/2018 10:16 AM by Conard NovakJackson, Cashton Hosley D, MD    [facility]inic [facility] Prenatal Labs  Dating  Blood type:     Genetic Screen 1 Screen:    AFP:     Quad:     NIPS: Antibody:   Anatomic US complete Rubella:   Varicella: @VZVIGG @  GTT Early:  7988              Third trimester:  RPR:     Rhogam  HBsAg:     TDaP vaccine                       Flu Shot: HIV:     [employer]  GBS:   Contraception  Pap: NILM  CBB   SS: neg  CS/VBAC    Support Person            Obesity affecting pregnancy 10/04/2017 by Conard NovakJackson, Ronan Dion D, MD No   BMI 30.0-30.9,adult 10/04/2017 by Conard NovakJackson, Aveon Colquhoun D, MD No       Preterm labor symptoms and general obstetric precautions including but not limited to vaginal bleeding, contractions, leaking of fluid and fetal movement were reviewed in detail with the patient. Please refer to After  Visit Summary for other counseling recommendations.   - 28 week labs today - work note provided  Return in about 2 weeks (around 03/22/2018) for Routine Prenatal Appointment.  Thomasene MohairStephen Nicoli Nardozzi, MD, [facility]inda FrederickFACOG [facility], [facility] 03/08/2018 10:34 AM

## 2018-03-09 LAB — 28 WEEK RH+PANEL
BASOS: 0 %
Basophils Absolute: 0 10*3/uL (ref 0.0–0.2)
EOS (ABSOLUTE): 0.1 10*3/uL (ref 0.0–0.4)
Eos: 1 %
GESTATIONAL DIABETES SCREEN: 117 mg/dL (ref 65–139)
HEMATOCRIT: 29.6 % — AB (ref 34.0–46.6)
HIV Screen 4th Generation wRfx: NONREACTIVE
Hemoglobin: 10.1 g/dL — ABNORMAL LOW (ref 11.1–15.9)
IMMATURE GRANS (ABS): 0.3 10*3/uL — AB (ref 0.0–0.1)
Immature Granulocytes: 2 %
LYMPHS ABS: 2.5 10*3/uL (ref 0.7–3.1)
LYMPHS: 17 %
MCH: 29 pg (ref 26.6–33.0)
MCHC: 34.1 g/dL (ref 31.5–35.7)
MCV: 85 fL (ref 79–97)
Monocytes Absolute: 1.1 10*3/uL — ABNORMAL HIGH (ref 0.1–0.9)
Monocytes: 8 %
NEUTROS ABS: 10.2 10*3/uL — AB (ref 1.4–7.0)
Neutrophils: 72 %
Platelets: 211 10*3/uL (ref 150–450)
RBC: 3.48 x10E6/uL — AB (ref 3.77–5.28)
RDW: 13.2 % (ref 12.3–15.4)
RPR: NONREACTIVE
WBC: 14.3 10*3/uL — ABNORMAL HIGH (ref 3.4–10.8)

## 2018-03-22 ENCOUNTER — Ambulatory Visit (INDEPENDENT_AMBULATORY_CARE_PROVIDER_SITE_OTHER): Payer: Medicaid Other | Admitting: Obstetrics and Gynecology

## 2018-03-22 ENCOUNTER — Encounter: Payer: Self-pay | Admitting: Obstetrics and Gynecology

## 2018-03-22 VITALS — BP 110/60 | Wt 160.0 lb

## 2018-03-22 DIAGNOSIS — O0990 Supervision of high risk pregnancy, unspecified, unspecified trimester: Secondary | ICD-10-CM

## 2018-03-22 DIAGNOSIS — O4443 Low lying placenta NOS or without hemorrhage, third trimester: Secondary | ICD-10-CM

## 2018-03-22 DIAGNOSIS — Z23 Encounter for immunization: Secondary | ICD-10-CM | POA: Diagnosis not present

## 2018-03-22 DIAGNOSIS — Z6830 Body mass index (BMI) 30.0-30.9, adult: Secondary | ICD-10-CM

## 2018-03-22 DIAGNOSIS — O4440 Low lying placenta NOS or without hemorrhage, unspecified trimester: Secondary | ICD-10-CM

## 2018-03-22 DIAGNOSIS — O99213 Obesity complicating pregnancy, third trimester: Secondary | ICD-10-CM

## 2018-03-22 DIAGNOSIS — Z3A29 29 weeks gestation of pregnancy: Secondary | ICD-10-CM

## 2018-03-22 LAB — POCT URINALYSIS DIPSTICK OB
Glucose, UA: NEGATIVE — AB
PROTEIN: NEGATIVE

## 2018-03-22 NOTE — Progress Notes (Signed)
Routine Prenatal Care Visit  Subjective  Alyssa Duffy is a 22 y.o. G1P0 at 4544w4d being seen today for ongoing prenatal care.  She is currently monitored for the following issues for this low-risk pregnancy and has Supervision of high risk pregnancy, antepartum; Obesity affecting pregnancy; BMI 30.0-30.9,adult; and Low-lying placenta on their problem list.  ----------------------------------------------------------------------------------- Patient reports no complaints.   Contractions: Not present. Vag. Bleeding: None.  Movement: Present. Denies leaking of fluid.  ----------------------------------------------------------------------------------- The following portions of the patient's history were reviewed and updated as appropriate: allergies, current medications, past family history, past medical history, past social history, past surgical history and problem list. Problem list updated.   Objective  Blood pressure 110/60, weight 160 lb (72.6 kg), last menstrual period 08/03/2017. Pregravid weight 148 lb (67.1 kg) Total Weight Gain 12 lb (5.443 kg) Urinalysis:      Fetal Status: Fetal Heart Rate (bpm): 155 Fundal Height: 30 cm Movement: Present     General:  Alert, oriented and cooperative. Patient is in no acute distress.  Skin: Skin is warm and dry. No rash noted.   Cardiovascular: Normal heart rate noted  Respiratory: Normal respiratory effort, no problems with respiration noted  Abdomen: Soft, gravid, appropriate for gestational age. Pain/Pressure: Absent     Pelvic:  Cervical exam deferred        Extremities: Normal range of motion.  Edema: None  Mental Status: Normal mood and affect. Normal behavior. Normal judgment and thought content.   Assessment   22 y.o. G1P0 at 6544w4d by  06/03/2018, by Ultrasound presenting for routine prenatal visit  Plan   pregnancy Problems (from 10/04/17 to present)    Problem Noted Resolved   Low-lying placenta 01/07/2018 by Conard NovakJackson, Roseanna Koplin  D, MD No   Overview Addendum 03/08/2018 10:16 AM by Conard NovakJackson, Rakayla Ricklefs D, MD    [x]  f/u at 28 weeks - resolved      Supervision of high risk pregnancy, antepartum 10/04/2017 by Conard NovakJackson, Atziri Zubiate D, MD No   Overview Addendum 03/09/2018 12:56 PM by Conard NovakJackson, Anayla Giannetti D, MD    [facility] Prenatal [facility]  Dating  Blood type:     Genetic Screen 1 Screen:    AFP:     Quad:     NIPS: Antibody:   Anatomic US complete Rubella:   Varicella: @VZVIGG @  GTT Early:  88              Third trimester: 117 RPR:     Rhogam  HBsAg:     TDaP vaccine                       Flu Shot: HIV:     Baby Food                                GBS:   Contraception  Pap: NILM  CBB   SS: neg  CS/VBAC    Support Person            Obesity affecting pregnancy 10/04/2017 by Conard NovakJackson, Danitza Schoenfeldt D, MD No   BMI 30.0-30.9,adult 10/04/2017 by Conard NovakJackson, Latonya Knight D, MD No       Preterm labor symptoms and general obstetric precautions including but not limited to vaginal bleeding, contractions, leaking of fluid and fetal movement were reviewed in detail with the patient. Please refer to After Visit Summary for other counseling recommendations.   - start iron, fusion plus samples given -  recommend birthing/breastfeeding classes at [facility] - start considering contraception, pediatrician, etc  Return in about 2 weeks (around 04/05/2018) for Routine Prenatal Appointment.  Thomasene Mohair, MD, [facility], [facility] 03/22/2018 10:34 AM

## 2018-04-05 ENCOUNTER — Other Ambulatory Visit: Payer: Self-pay | Admitting: Obstetrics and Gynecology

## 2018-04-05 ENCOUNTER — Encounter: Payer: Self-pay | Admitting: Obstetrics and Gynecology

## 2018-04-05 ENCOUNTER — Ambulatory Visit (INDEPENDENT_AMBULATORY_CARE_PROVIDER_SITE_OTHER): Payer: Medicaid Other | Admitting: Obstetrics and Gynecology

## 2018-04-05 VITALS — BP 118/78 | Wt 160.0 lb

## 2018-04-05 DIAGNOSIS — O368390 Maternal care for abnormalities of the fetal heart rate or rhythm, unspecified trimester, not applicable or unspecified: Secondary | ICD-10-CM

## 2018-04-05 DIAGNOSIS — Z6830 Body mass index (BMI) 30.0-30.9, adult: Secondary | ICD-10-CM

## 2018-04-05 DIAGNOSIS — Z3A31 31 weeks gestation of pregnancy: Secondary | ICD-10-CM

## 2018-04-05 DIAGNOSIS — O99213 Obesity complicating pregnancy, third trimester: Secondary | ICD-10-CM

## 2018-04-05 DIAGNOSIS — O4443 Low lying placenta NOS or without hemorrhage, third trimester: Secondary | ICD-10-CM

## 2018-04-05 DIAGNOSIS — O0990 Supervision of high risk pregnancy, unspecified, unspecified trimester: Secondary | ICD-10-CM

## 2018-04-05 DIAGNOSIS — O4440 Low lying placenta NOS or without hemorrhage, unspecified trimester: Secondary | ICD-10-CM

## 2018-04-05 LAB — POCT URINALYSIS DIPSTICK OB
GLUCOSE, UA: NEGATIVE
POC,PROTEIN,UA: NEGATIVE

## 2018-04-05 NOTE — Progress Notes (Signed)
Routine Prenatal Care Visit  Subjective  Alyssa Duffy is a 22 y.o. G1P0 at [redacted]w[redacted]d being seen today for ongoing prenatal care.  She is currently monitored for the following issues for this low-risk pregnancy and has Supervision of high risk pregnancy, antepartum; Obesity affecting pregnancy; BMI 30.0-30.9,adult; Low-lying placenta; and Fetal arrhythmia affecting pregnancy, antepartum on their problem list.  ----------------------------------------------------------------------------------- Patient reports no complaints.   Contractions: Not present. Vag. Bleeding: None.  Movement: Present. Denies leaking of fluid.  ----------------------------------------------------------------------------------- The following portions of the patient's history were reviewed and updated as appropriate: allergies, current medications, past family history, past medical history, past social history, past surgical history and problem list. Problem list updated.   Objective  Blood pressure 118/78, weight 160 lb (72.6 kg), last menstrual period 08/03/2017. Pregravid weight 148 lb (67.1 kg) Total Weight Gain 12 lb (5.443 kg) Urinalysis: Urine Protein Negative  Urine Glucose Negative  Fetal Status: Fetal Heart Rate (bpm): 140 Fundal Height: 32 cm Movement: Present     General:  Alert, oriented and cooperative. Patient is in no acute distress.  Skin: Skin is warm and dry. No rash noted.   Cardiovascular: Normal heart rate noted  Respiratory: Normal respiratory effort, no problems with respiration noted  Abdomen: Soft, gravid, appropriate for gestational age. Pain/Pressure: Present     Pelvic:  Cervical exam deferred        Extremities: Normal range of motion.  Edema: None  Mental Status: Normal mood and affect. Normal behavior. Normal judgment and thought content.   Bedside ultrasound performed due to ausculation of what appeared to a fetal arrhythmia.   Visualization of the heart rhythm did indeed demonstrate  an abnormal rhythm.  Good fetal movement noted throughout.  Heart rate normal otherwise (140-150 range).   Assessment   22 y.o. G1P0 at [redacted]w[redacted]d by  06/03/2018, by Ultrasound presenting for routine prenatal visit  Plan   pregnancy Problems (from 10/04/17 to present)    Problem Noted Resolved   Fetal arrhythmia affecting pregnancy, antepartum 04/05/2018 by Conard Novak, MD No   Overview Signed 04/06/2018  8:58 AM by Conard Novak, MD    [ ]  referral to MFM      Low-lying placenta 01/07/2018 by Conard Novak, MD No   Overview Addendum 03/08/2018 10:16 AM by Conard Novak, MD    [x]  f/u at 28 weeks - resolved      Supervision of high risk pregnancy, antepartum 10/04/2017 by Conard Novak, MD No   Overview Addendum 03/09/2018 12:56 PM by Conard Novak, MD    [facility] Prenatal Labs  Dating  Blood type:     Genetic Screen 1 Screen:    AFP:     Quad:     NIPS: Antibody:   Anatomic [place] complete Rubella:   Varicella: @VZVIGG @  GTT Early:  78              Third trimester: 117 RPR:     Rhogam  HBsAg:     TDaP vaccine                       Flu Shot: HIV:     Baby Food                                GBS:   Contraception  Pap: NILM  CBB   SS: neg  CS/VBAC  Support Person            Obesity affecting pregnancy 10/04/2017 by Conard Novak, MD No   BMI 30.0-30.9,adult 10/04/2017 by Conard Novak, MD No      Preterm labor symptoms and general obstetric precautions including but not limited to vaginal bleeding, contractions, leaking of fluid and fetal movement were reviewed in detail with the patient. Please refer to After Visit Summary for other counseling recommendations.   - Referral to MFM for evaluation of fetal arrhythmia ASAP. Will have her follow up soon, if she can't get in to see the MFM soon.  (note: appt scheduled for 9/19). Will monitor her closely. Until that time.  Most likely diagnosis is PAC vs PVC.  However, will verify through MFM  performing M mode evaluation vs peds cardiology consult.   Return in about 3 days (around 04/08/2018) for Routine Prenatal Appointment w Dr. Jean Rosenthal.  Thomasene Mohair, MD, [facility], [facility] 04/06/2018 8:58 AM

## 2018-04-08 ENCOUNTER — Encounter: Payer: Self-pay | Admitting: Obstetrics and Gynecology

## 2018-04-08 ENCOUNTER — Ambulatory Visit (INDEPENDENT_AMBULATORY_CARE_PROVIDER_SITE_OTHER): Payer: Medicaid Other | Admitting: Obstetrics and Gynecology

## 2018-04-08 VITALS — BP 120/64 | Wt 160.0 lb

## 2018-04-08 DIAGNOSIS — O368390 Maternal care for abnormalities of the fetal heart rate or rhythm, unspecified trimester, not applicable or unspecified: Secondary | ICD-10-CM

## 2018-04-08 DIAGNOSIS — Z3A32 32 weeks gestation of pregnancy: Secondary | ICD-10-CM

## 2018-04-08 DIAGNOSIS — O4443 Low lying placenta NOS or without hemorrhage, third trimester: Secondary | ICD-10-CM

## 2018-04-08 DIAGNOSIS — O0990 Supervision of high risk pregnancy, unspecified, unspecified trimester: Secondary | ICD-10-CM

## 2018-04-08 DIAGNOSIS — O99213 Obesity complicating pregnancy, third trimester: Secondary | ICD-10-CM

## 2018-04-08 DIAGNOSIS — O4440 Low lying placenta NOS or without hemorrhage, unspecified trimester: Secondary | ICD-10-CM

## 2018-04-08 DIAGNOSIS — Z6830 Body mass index (BMI) 30.0-30.9, adult: Secondary | ICD-10-CM

## 2018-04-08 NOTE — Progress Notes (Signed)
Routine Prenatal Care Visit  Subjective  Alyssa Duffy is a 22 y.o. G1P0 at [redacted]w[redacted]d being seen today for ongoing prenatal care.  She is currently monitored for the following issues for this high-risk pregnancy and has Supervision of high risk pregnancy, antepartum; Obesity affecting pregnancy; BMI 30.0-30.9,adult; Low-lying placenta; and Fetal arrhythmia affecting pregnancy, antepartum on their problem list.  ----------------------------------------------------------------------------------- Patient reports no complaints.   Contractions: Not present. Vag. Bleeding: None.  Movement: Present. Denies leaking of fluid.  ----------------------------------------------------------------------------------- The following portions of the patient's history were reviewed and updated as appropriate: allergies, current medications, past family history, past medical history, past social history, past surgical history and problem list. Problem list updated.   Objective  Blood pressure 120/64, weight 160 lb (72.6 kg), last menstrual period 08/03/2017. Pregravid weight 148 lb (67.1 kg) Total Weight Gain 12 lb (5.443 kg) Urinalysis: Urine Protein    Urine Glucose    Fetal Status: Fetal Heart Rate (bpm): 150   Movement: Present     General:  Alert, oriented and cooperative. Patient is in no acute distress.  Skin: Skin is warm and dry. No rash noted.   Cardiovascular: Normal heart rate noted  Respiratory: Normal respiratory effort, no problems with respiration noted  Abdomen: Soft, gravid, appropriate for gestational age. Pain/Pressure: Present     Pelvic:  Cervical exam deferred        Extremities: Normal range of motion.  Edema: None  Mental Status: Normal mood and affect. Normal behavior. Normal judgment and thought content.   Assessment   22 y.o. G1P0 at [redacted]w[redacted]d by  06/03/2018, by Ultrasound presenting for routine prenatal visit  Plan   pregnancy Problems (from 10/04/17 to present)    Problem Noted  Resolved   Fetal arrhythmia affecting pregnancy, antepartum 04/05/2018 by Conard Novak, MD No   Overview Signed 04/06/2018  8:58 AM by Conard Novak, MD    [ ]  referral to MFM      Low-lying placenta 01/07/2018 by Conard Novak, MD No   Overview Addendum 03/08/2018 10:16 AM by Conard Novak, MD    [x]  f/u at 28 weeks - resolved      Supervision of high risk pregnancy, antepartum 10/04/2017 by Conard Novak, MD No   Overview Addendum 03/09/2018 12:56 PM by Conard Novak, MD    [facility] Prenatal [facility]  Dating  Blood type:     Genetic Screen 1 Screen:    AFP:     Quad:     NIPS: Antibody:   Anatomic [place] complete Rubella:   Varicella: @VZVIGG @  GTT Early:  88              Third trimester: 117 RPR:     Rhogam  HBsAg:     TDaP vaccine                       Flu Shot: HIV:     Baby Food                                GBS:   Contraception  Pap: NILM  CBB   SS: neg  CS/VBAC    Support Person            Obesity affecting pregnancy 10/04/2017 by Conard Novak, MD No   BMI 30.0-30.9,adult 10/04/2017 by Conard Novak, MD No      Preterm labor  symptoms and general obstetric precautions including but not limited to vaginal bleeding, contractions, leaking of fluid and fetal movement were reviewed in detail with the patient. Please refer to After Visit Summary for other counseling recommendations.   - heart tone regular today. Good fetal movement felt throughout. Encouraged patient to keep MFM appt on 9/19.   Return in about 2 weeks (around 04/22/2018) for Routine Prenatal Appointment.  Thomasene MohairStephen Rene Sizelove, MD, [facility] FrederickFACOG [facility], [facility] 04/08/2018 9:06 AM

## 2018-04-11 ENCOUNTER — Other Ambulatory Visit: Payer: Self-pay

## 2018-04-11 DIAGNOSIS — O368390 Maternal care for abnormalities of the fetal heart rate or rhythm, unspecified trimester, not applicable or unspecified: Secondary | ICD-10-CM

## 2018-04-14 ENCOUNTER — Ambulatory Visit
Admission: RE | Admit: 2018-04-14 | Discharge: 2018-04-14 | Disposition: A | Discharge status: Home / Self Care | Payer: Medicaid Other | Source: Ambulatory Visit | Attending: Obstetrics and Gynecology | Admitting: Obstetrics and Gynecology

## 2018-04-14 ENCOUNTER — Ambulatory Visit
Admission: RE | Admit: 2018-04-14 | Payer: Medicaid Other | Source: Ambulatory Visit | Attending: Obstetrics and Gynecology | Admitting: Obstetrics and Gynecology

## 2018-04-14 DIAGNOSIS — O0990 Supervision of high risk pregnancy, unspecified, unspecified trimester: Secondary | ICD-10-CM

## 2018-04-14 DIAGNOSIS — Z3A31 31 weeks gestation of pregnancy: Secondary | ICD-10-CM

## 2018-04-14 DIAGNOSIS — O0993 Supervision of high risk pregnancy, unspecified, third trimester: Secondary | ICD-10-CM | POA: Insufficient documentation

## 2018-04-14 DIAGNOSIS — Z3A32 32 weeks gestation of pregnancy: Secondary | ICD-10-CM | POA: Insufficient documentation

## 2018-04-14 DIAGNOSIS — O368390 Maternal care for abnormalities of the fetal heart rate or rhythm, unspecified trimester, not applicable or unspecified: Secondary | ICD-10-CM

## 2018-04-14 HISTORY — DX: Other specified health status: Z78.9

## 2018-04-25 ENCOUNTER — Encounter: Payer: Self-pay | Admitting: Obstetrics and Gynecology

## 2018-04-25 ENCOUNTER — Ambulatory Visit (INDEPENDENT_AMBULATORY_CARE_PROVIDER_SITE_OTHER): Payer: Medicaid Other | Admitting: Obstetrics and Gynecology

## 2018-04-25 VITALS — BP 122/74 | Wt 164.0 lb

## 2018-04-25 DIAGNOSIS — O0990 Supervision of high risk pregnancy, unspecified, unspecified trimester: Secondary | ICD-10-CM

## 2018-04-25 DIAGNOSIS — O99213 Obesity complicating pregnancy, third trimester: Secondary | ICD-10-CM

## 2018-04-25 DIAGNOSIS — O368390 Maternal care for abnormalities of the fetal heart rate or rhythm, unspecified trimester, not applicable or unspecified: Secondary | ICD-10-CM

## 2018-04-25 DIAGNOSIS — O4440 Low lying placenta NOS or without hemorrhage, unspecified trimester: Secondary | ICD-10-CM

## 2018-04-25 DIAGNOSIS — Z3A34 34 weeks gestation of pregnancy: Secondary | ICD-10-CM

## 2018-04-25 DIAGNOSIS — Z6830 Body mass index (BMI) 30.0-30.9, adult: Secondary | ICD-10-CM

## 2018-04-25 NOTE — Progress Notes (Signed)
Routine Prenatal Care Visit  Subjective  Alyssa Duffy is a 22 y.o. G1P0 at [redacted]w[redacted]d being seen today for ongoing prenatal care.  She is currently monitored for the following issues for this low-risk pregnancy and has Supervision of high risk pregnancy, antepartum; Obesity affecting pregnancy; BMI 30.0-30.9,adult; Low-lying placenta; and Fetal arrhythmia affecting pregnancy, antepartum on their problem list.  ----------------------------------------------------------------------------------- Patient reports no complaints.   Contractions: Not present. Vag. Bleeding: None.  Movement: Present. Denies leaking of fluid.  ----------------------------------------------------------------------------------- The following portions of the patient's history were reviewed and updated as appropriate: allergies, current medications, past family history, past medical history, past social history, past surgical history and problem list. Problem list updated.   Objective  Blood pressure 122/74, weight 164 lb (74.4 kg), last menstrual period 08/03/2017. Pregravid weight 148 lb (67.1 kg) Total Weight Gain 16 lb (7.258 kg) Urinalysis: Urine Protein    Urine Glucose    Fetal Status: Fetal Heart Rate (bpm): 145 Fundal Height: 38 cm Movement: Present     General:  Alert, oriented and cooperative. Patient is in no acute distress.  Skin: Skin is warm and dry. No rash noted.   Cardiovascular: Normal heart rate noted  Respiratory: Normal respiratory effort, no problems with respiration noted  Abdomen: Soft, gravid, appropriate for gestational age. Pain/Pressure: Present     Pelvic:  Cervical exam deferred        Extremities: Normal range of motion.  Edema: None  Mental Status: Normal mood and affect. Normal behavior. Normal judgment and thought content.   Assessment   22 y.o. G1P0 at [redacted]w[redacted]d by  06/03/2018, by Ultrasound presenting for routine prenatal visit  Plan   pregnancy Problems (from 10/04/17 to present)     Problem Noted Resolved   Fetal arrhythmia affecting pregnancy, antepartum 04/05/2018 by Conard Novak, MD No   Overview Addendum 04/25/2018  9:41 AM by Conard Novak, MD    [x]  referral to MFM - evaluated. Not noted on ultrasound with [facility]      Low-lying placenta 01/07/2018 by Conard Novak, MD No   Overview Addendum 03/08/2018 10:16 AM by Conard Novak, MD    [x]  f/u at 28 weeks - resolved      Supervision of high risk pregnancy, antepartum 10/04/2017 by Conard Novak, MD No   Overview Addendum 03/09/2018 12:56 PM by Conard Novak, MD    [facility] Prenatal [facility]  Dating  Blood type:     Genetic Screen 1 Screen:    AFP:     Quad:     NIPS: Antibody:   Anatomic [place] complete Rubella:   Varicella: @VZVIGG @  GTT Early:  88              Third trimester: 117 RPR:     Rhogam  HBsAg:     TDaP vaccine                       Flu Shot: HIV:     Baby Food                                GBS:   Contraception  Pap: NILM  CBB   SS: neg  CS/VBAC    Support Person            Obesity affecting pregnancy 10/04/2017 by Conard Novak, MD No   BMI 30.0-30.9,adult 10/04/2017 by Conard Novak,  MD No       Preterm labor symptoms and general obstetric precautions including but not limited to vaginal bleeding, contractions, leaking of fluid and fetal movement were reviewed in detail with the patient. Please refer to After Visit Summary for other counseling recommendations.   Return in about 2 weeks (around 05/09/2018) for u/s for growth and Routine Prenatal Appointment.  Thomasene Mohair, MD, [facility], [facility] 04/25/2018 9:52 AM

## 2018-05-09 ENCOUNTER — Other Ambulatory Visit: Payer: Self-pay

## 2018-05-09 ENCOUNTER — Observation Stay
Admission: EM | Admit: 2018-05-09 | Discharge: 2018-05-09 | Disposition: A | Discharge status: Home / Self Care | Payer: Medicaid Other | Attending: Certified Nurse Midwife | Admitting: Certified Nurse Midwife

## 2018-05-09 ENCOUNTER — Encounter: Payer: Self-pay | Admitting: Obstetrics and Gynecology

## 2018-05-09 ENCOUNTER — Ambulatory Visit (INDEPENDENT_AMBULATORY_CARE_PROVIDER_SITE_OTHER): Payer: Medicaid Other

## 2018-05-09 ENCOUNTER — Ambulatory Visit (INDEPENDENT_AMBULATORY_CARE_PROVIDER_SITE_OTHER): Payer: Medicaid Other | Admitting: Obstetrics and Gynecology

## 2018-05-09 VITALS — BP 136/84 | Wt 170.0 lb

## 2018-05-09 DIAGNOSIS — O26893 Other specified pregnancy related conditions, third trimester: Secondary | ICD-10-CM

## 2018-05-09 DIAGNOSIS — O368390 Maternal care for abnormalities of the fetal heart rate or rhythm, unspecified trimester, not applicable or unspecified: Secondary | ICD-10-CM

## 2018-05-09 DIAGNOSIS — Z362 Encounter for other antenatal screening follow-up: Secondary | ICD-10-CM | POA: Diagnosis not present

## 2018-05-09 DIAGNOSIS — Z3A36 36 weeks gestation of pregnancy: Secondary | ICD-10-CM | POA: Insufficient documentation

## 2018-05-09 DIAGNOSIS — O99213 Obesity complicating pregnancy, third trimester: Secondary | ICD-10-CM

## 2018-05-09 DIAGNOSIS — O163 Unspecified maternal hypertension, third trimester: Secondary | ICD-10-CM

## 2018-05-09 DIAGNOSIS — O368330 Maternal care for abnormalities of the fetal heart rate or rhythm, third trimester, not applicable or unspecified: Principal | ICD-10-CM | POA: Insufficient documentation

## 2018-05-09 DIAGNOSIS — O4443 Low lying placenta NOS or without hemorrhage, third trimester: Secondary | ICD-10-CM

## 2018-05-09 DIAGNOSIS — Z6830 Body mass index (BMI) 30.0-30.9, adult: Secondary | ICD-10-CM

## 2018-05-09 DIAGNOSIS — R030 Elevated blood-pressure reading, without diagnosis of hypertension: Secondary | ICD-10-CM

## 2018-05-09 DIAGNOSIS — O0990 Supervision of high risk pregnancy, unspecified, unspecified trimester: Secondary | ICD-10-CM

## 2018-05-09 DIAGNOSIS — O4440 Low lying placenta NOS or without hemorrhage, unspecified trimester: Secondary | ICD-10-CM

## 2018-05-09 LAB — CBC
HCT: 32.2 % — ABNORMAL LOW (ref 36.0–46.0)
HEMOGLOBIN: 10.6 g/dL — AB (ref 12.0–15.0)
MCH: 28.8 pg (ref 26.0–34.0)
MCHC: 32.9 g/dL (ref 30.0–36.0)
MCV: 87.5 fL (ref 80.0–100.0)
NRBC: 0 % (ref 0.0–0.2)
PLATELETS: 206 10*3/uL (ref 150–400)
RBC: 3.68 MIL/uL — AB (ref 3.87–5.11)
RDW: 15.3 % (ref 11.5–15.5)
WBC: 10.6 10*3/uL — AB (ref 4.0–10.5)

## 2018-05-09 LAB — COMPREHENSIVE METABOLIC PANEL
ALBUMIN: 2.4 g/dL — AB (ref 3.5–5.0)
ALT: 11 U/L (ref 0–44)
ANION GAP: 9 (ref 5–15)
AST: 31 U/L (ref 15–41)
Alkaline Phosphatase: 194 U/L — ABNORMAL HIGH (ref 38–126)
BILIRUBIN TOTAL: 0.8 mg/dL (ref 0.3–1.2)
BUN: 5 mg/dL — ABNORMAL LOW (ref 6–20)
CO2: 22 mmol/L (ref 22–32)
Calcium: 9.2 mg/dL (ref 8.9–10.3)
Chloride: 104 mmol/L (ref 98–111)
Creatinine, Ser: 0.45 mg/dL (ref 0.44–1.00)
GFR calc Af Amer: >60 mL/min (ref >60)
GFR calc non Af Amer: >60 mL/min (ref >60)
GLUCOSE: 74 mg/dL (ref 70–99)
POTASSIUM: 3.9 mmol/L (ref 3.5–5.1)
Sodium: 135 mmol/L (ref 135–145)
TOTAL PROTEIN: 6.5 g/dL (ref 6.5–8.1)

## 2018-05-09 LAB — POCT URINALYSIS DIPSTICK OB: GLUCOSE, UA: NEGATIVE

## 2018-05-09 LAB — PROTEIN / CREATININE RATIO, URINE
CREATININE, URINE: 70 mg/dL
PROTEIN CREATININE RATIO: 0.14 mg/mg{creat} (ref 0.00–0.15)
TOTAL PROTEIN, URINE: 10 mg/dL

## 2018-05-09 NOTE — OB Triage Note (Signed)
Patient arrived on unit via wheelchair from [facility] office for [facility] evaluation.  No complaints

## 2018-05-09 NOTE — Final Progress Note (Signed)
Physician Final Progress Note  Patient ID: Alyssa Duffy MRN: [id number] DOB/AGE: [birth year] 24 y.o.  Admit date: 05/09/2018 Admitting provider: Nadara Mustard, MD/ Gasper Lloyd. Sharen Hones, CNM Discharge date: 05/09/2018   Admission Diagnoses: IUP at 36wk3d Elevated blood pressure and fetal tachycardia  Discharge Diagnoses: Elevated blood pressure at 36wk 3d Reactive NST    Consults: None  Significant Findings/ Diagnostic Studies:    ALIECE Duffy is a 22 y.o. G1P0 female with EDC=06/03/2018 at [redacted]w[redacted]d dated by a 7 week ultrasound.  She presents to L&D from the office for a PIH evaluation and fetal monitoring. Blood pressures in office were 136/84 and 140/88. Fetal heart rate on ultrasound and in the office were 180s. EFW on ultrasound was in the 89th% (7#15oz) with normal AFI (13.3cm), cephalic presentation. Has had occasional right temporal headaches( has a history or migraines prior to pregnancy), but not at this time. Denies visual changes, nausea/ vomiting, RUQ pain, vaginal bleeding, or chest pain. Has some lower abdominal discomfort with contractions. Cervix closed in office today.  Has been moving into a different home and feels like she is a little anxious regarding getting closer to her due date and labor. Baby has been active. Prenatal care site: Prenatal care at [facility] has been remarkable for low lying placenta and fetal arrhythmia-both have resolved. Her care has also been notable for anemia and for the following:  [facility] Prenatal Labs  Dating 7 week ultrasound Blood type: O/Positive/-- (03/22 1037)   Genetic Screen 1 Screen:    AFP:     Quad:     NIPS: Antibody:Negative (03/22 1037)  Anatomic [place] complete Rubella: <0.90 (03/22 1037) Varicella: @VZVIGG @  GTT Early:  88              Third trimester: 117 RPR: Non Reactive (08/13 1003)   Rhogam  HBsAg: Negative (03/22 1037)   TDaP vaccine              Flu Shot: 03/22/18 HIV: Non Reactive (08/13 1003)    Baby Food       Breast                         GBS:   Contraception  Pap: NILM  CBB   SS: neg  CS/VBAC    Support Person           Maternal Medical History:   Past Medical History:  Diagnosis Date  . Medical history non-contributory     History reviewed. No pertinent surgical history.  Allergies  Allergen Reactions  . Pecan Pollen Other (See Comments)    Allergies x's 1,000 per pt    Prior to Admission medications   Medication Sig Start Date End Date Taking? Authorizing Provider  Iron-FA-B Cmp-C-Biot-Probiotic (FUSION PLUS) CAPS Take 1 capsule by mouth daily.   Yes [provider]      Social History: She  reports that she has never smoked. She has never used smokeless tobacco. She reports that she does not drink alcohol or use drugs.  Family History: family history includes Diabetes Mellitus II in her maternal aunt.   Review of Systems: Negative x 10 systems reviewed except as noted in the HPI.      Physical Exam:  Vital Signs: BP 127/77   Pulse (!) 119   Temp 97.6 F (36.4 C) (Oral)   Ht 4\' 11"  (1.499 m)   Wt 74.4 kg   LMP 08/03/2017  BMI 33.12 kg/m   Patient Vitals for the past 24 hrs:  BP Temp Temp src Pulse Height Weight  05/09/18 1318 127/77 97.6 F (36.4 C) Oral (!) 119 - -  05/09/18 1309 130/77 - - (!) 121 - -  05/09/18 1254 121/71 - - (!) 113 - -  05/09/18 1239 115/70 - - (!) 114 - -  05/09/18 1224 127/76 - - (!) 109 - -  05/09/18 1219 138/61 - - (!) 108 - -  05/09/18 1209 (!) 166/137 - - 91 - -  05/09/18 1203 127/70 - - (!) 105 - -  05/09/18 1140 128/76 - - (!) 112 - -  05/09/18 1124 124/74 - - (!) 108 - -  05/09/18 1115 121/79 - - (!) 120 - -  05/09/18 1057 - - - - 4\' 11"  (1.499 m) 74.4 kg  05/09/18 1054 (!) 129/59 - - (!) 108 - -  05/09/18 1053 (!) 122/47 - - (!) 119 - -  05/09/18 0950 140/88 - - - - -  the elevated blood pressure came after changing cuffs and was not witnessed. ? Whether patient was lying on cuff or had arm  flexed?  General: no acute distress.  HEENT: normocephalic, atraumatic Heart: AP rate 108 & regular rhythm.  No murmurs/rubs/gallops Lungs: clear to auscultation bilaterally Abdomen: soft, gravid, non-tender;  EFW: 7#15oz Pelvic:   External: Normal external female genitalia  Cervix:: closed/40% per Dr Edison Pace exam  Extremities: non-tender, symmetric, trace edema bilaterally.  DTRs: +1   Neurologic: Alert & oriented x 3.     Baseline FHR: initially FHR baseline was 155 with accelerations to 180-200, moderate variability. At the end of monitoring, baseline was 145-150 with accelerations to 170. No decelerations Toco: contractions, mild, every 2-5 minutes apart  Results for orders placed or performed during the hospital encounter of 05/09/18 (from the past 24 hour(s))  Comprehensive metabolic panel     Status: Abnormal   Collection Time: 05/09/18 11:24 AM  Result Value Ref Range   Sodium 135 135 - 145 mmol/L   Potassium 3.9 3.5 - 5.1 mmol/L   Chloride 104 98 - 111 mmol/L   CO2 22 22 - 32 mmol/L   Glucose, Bld 74 70 - 99 mg/dL   BUN 5 (L) 6 - 20 mg/dL   Creatinine, Ser 1.91 0.44 - 1.00 mg/dL   Calcium 9.2 8.9 - 47.8 mg/dL   Total Protein 6.5 6.5 - 8.1 g/dL   Albumin 2.4 (L) 3.5 - 5.0 g/dL   AST 31 15 - 41 U/L   ALT 11 0 - 44 U/L   Alkaline Phosphatase 194 (H) 38 - 126 U/L   Total Bilirubin 0.8 0.3 - 1.2 mg/dL   GFR calc non Af Amer >60 >60 mL/min   GFR calc Af Amer >60 >60 mL/min   Anion gap 9 5 - 15  CBC     Status: Abnormal   Collection Time: 05/09/18 11:24 AM  Result Value Ref Range   WBC 10.6 (H) 4.0 - 10.5 K/uL   RBC 3.68 (L) 3.87 - 5.11 MIL/uL   Hemoglobin 10.6 (L) 12.0 - 15.0 g/dL   HCT 29.5 (L) 62.1 - 30.8 %   MCV 87.5 80.0 - 100.0 fL   MCH 28.8 26.0 - 34.0 pg   MCHC 32.9 30.0 - 36.0 g/dL   RDW 65.7 84.6 - 96.2 %   Platelets 206 150 - 400 K/uL   nRBC 0.0 0.0 - 0.2 %  Protein / creatinine ratio,  urine     Status: None   Collection Time: 05/09/18 11:24 AM   Result Value Ref Range   Creatinine, Urine 70 mg/dL   Total Protein, Urine 10 mg/dL   Protein Creatinine Ratio 0.14 0.00 - 0.15 mg/mg[Cre]   A: IUP at 36wk 3d with reactive NST Normotensive except for one blood pressure (unsure whether valid) Normal PIH labs/ asymptomatic of preeclampsia  P: Consulted Dr Tiburcio Pea regarding plan of management. FU visit in 3 days for blood pressure check Preeclampsia precautions.  Procedures: NST  Discharge Condition: stable  Disposition: Discharge disposition: 01-Home or Self Care       Diet: Regular diet  Discharge Activity: Activity as tolerated   Allergies as of 05/09/2018      Reactions   Pecan Pollen Other (See Comments)   Allergies x's 1,000 per pt      Medication List    TAKE these medications   FUSION PLUS Caps Take 1 capsule by mouth daily.      Follow-up Information    [facility] .   Contact information: [address] [place] [address]          Total time spent taking care of this patient: 20 minutes  Signed: Farrel Conners 05/09/2018, 1:17 PM

## 2018-05-09 NOTE — Progress Notes (Addendum)
Routine Prenatal Care Visit  Subjective  Alyssa Duffy is a 22 y.o. G1P0 at [redacted]w[redacted]d being seen today for ongoing prenatal care.  She is currently monitored for the following issues for this high-risk pregnancy and has Supervision of high risk pregnancy, antepartum; Obesity affecting pregnancy; BMI 30.0-30.9,adult; Low-lying placenta; and Fetal arrhythmia affecting pregnancy, antepartum on their problem list.  ----------------------------------------------------------------------------------- Patient reports no complaints.  Denies HA, visual changes, and RUQ pain.  Contractions: Irregular. Vag. Bleeding: None.  Movement: Present. Denies leaking of fluid.  U/S shows growth 89.7%ile, AC>97.9%ile.  AFI 13.3 cm Weight 3,592 grams ----------------------------------------------------------------------------------- The following portions of the patient's history were reviewed and updated as appropriate: allergies, current medications, past family history, past medical history, past social history, past surgical history and problem list. Problem list updated.   Objective  Blood pressure 136/84, weight 170 lb (77.1 kg), last menstrual period 08/03/2017. BP RECHECK 140/88 Pregravid weight 148 lb (67.1 kg) Total Weight Gain 22 lb (9.979 kg) Urinalysis: Urine Protein    Urine Glucose    Fetal Status: Fetal Heart Rate (bpm): 180   Movement: Present  Presentation: Vertex  General:  Alert, oriented and cooperative. Patient is in no acute distress.  Skin: Skin is warm and dry. No rash noted.   Cardiovascular: Normal heart rate noted  Respiratory: Normal respiratory effort, no problems with respiration noted  Abdomen: Soft, gravid, appropriate for gestational age. Pain/Pressure: Present     Pelvic:  Cervical exam performed Dilation: Closed Effacement (%): 40 Station: -3  Extremities: Normal range of motion.  Edema: None  Mental Status: Normal mood and affect. Normal behavior. Normal judgment and thought  content.   Imaging results: US Ob Follow Up  Result Date: 05/09/2018 Patient Name: Alyssa Duffy DOB: [birth year] MRN: [id number] ULTRASOUND REPORT Location: [facility] Date of Service: 05/09/2018 Indications:growth/afi Findings: Mason Jim intrauterine pregnancy is visualized with FHR at 186 BPM (upper limits of normal). Biometrics give an (U/S) Gestational age of [redacted]w[redacted]d and an (U/S) EDD of 05/27/2018; this correlates with the clinically established Estimated Date of Delivery: 06/03/18. Fetal presentation is Cephalic. Placenta: anterior. Grade: 2/3 AFI: 13.3 cm Growth percentile is 89.7%. EFW: 3,592 grams (7lbs 15 oz) Impression: 1. [redacted]w[redacted]d Viable Singleton Intrauterine pregnancy previously established criteria with FHR measuring at upper limits of normal. 2. Growth is 89.7 %ile.  AFI is 13.3 cm. 3. AC >97.9.% Darlina Guys, RDMS RVT The ultrasound images and findings were reviewed by me and I agree with the above report. Thomasene Mohair, MD, [facility] 05/09/2018 9:55 AM      Assessment   22 y.o. G1P0 at [redacted]w[redacted]d by  06/03/2018, by Ultrasound presenting for routine prenatal visit  Plan   pregnancy Problems (from 10/04/17 to present)    Problem Noted Resolved   Fetal arrhythmia affecting pregnancy, antepartum 04/05/2018 by Conard Novak, MD No   Overview Addendum 04/25/2018  9:41 AM by Conard Novak, MD    [x]  referral to MFM - evaluated. Not noted on ultrasound with [facility]      Low-lying placenta 01/07/2018 by Conard Novak, MD No   Overview Addendum 03/08/2018 10:16 AM by Conard Novak, MD    [x]  f/u at 28 weeks - resolved      Supervision of high risk pregnancy, antepartum 10/04/2017 by Conard Novak, MD No   Overview Addendum 03/09/2018 12:56 PM by Conard Novak, MD    Clinic [facility] Prenatal Labs  Dating  Blood type:  Genetic Screen 1 Screen:    AFP:     Quad:     NIPS: Antibody:   Anatomic [place] complete Rubella:    Varicella: @VZVIGG @  GTT Early:  24              Third trimester: 117 RPR:     Rhogam  HBsAg:     TDaP vaccine                       Flu Shot: HIV:     Baby Food                                GBS:   Contraception  Pap: NILM  CBB   SS: neg  CS/VBAC    Support Person            Obesity affecting pregnancy 10/04/2017 by Conard Novak, MD No   BMI 30.0-30.9,adult 10/04/2017 by Conard Novak, MD No      Preterm labor symptoms and general obstetric precautions including but not limited to vaginal bleeding, contractions, leaking of fluid and fetal movement were reviewed in detail with the patient. Please refer to After Visit Summary for other counseling recommendations.   - to L&D for further evaluation for elevated BP and fetal tachycardia.   Return in about 1 week (around 05/16/2018) for Routine Prenatal Appointment.  Thomasene Mohair, MD, [facility], [facility] 05/09/2018 10:14 AM

## 2018-05-09 NOTE — Addendum Note (Signed)
Addended by: Liliane Shi on: 05/09/2018 11:58 AM   Modules accepted: Orders

## 2018-05-11 LAB — GC/CHLAMYDIA PROBE AMP
CHLAMYDIA, DNA PROBE: NEGATIVE
Neisseria gonorrhoeae by PCR: NEGATIVE

## 2018-05-11 LAB — STREP GP B NAA: STREP GROUP B AG: NEGATIVE

## 2018-05-12 ENCOUNTER — Ambulatory Visit (INDEPENDENT_AMBULATORY_CARE_PROVIDER_SITE_OTHER): Payer: Medicaid Other | Admitting: Obstetrics and Gynecology

## 2018-05-12 ENCOUNTER — Encounter: Payer: Self-pay | Admitting: Obstetrics and Gynecology

## 2018-05-12 VITALS — BP 128/82 | Wt 170.0 lb

## 2018-05-12 DIAGNOSIS — O368330 Maternal care for abnormalities of the fetal heart rate or rhythm, third trimester, not applicable or unspecified: Secondary | ICD-10-CM

## 2018-05-12 DIAGNOSIS — O163 Unspecified maternal hypertension, third trimester: Secondary | ICD-10-CM

## 2018-05-12 DIAGNOSIS — O99213 Obesity complicating pregnancy, third trimester: Secondary | ICD-10-CM

## 2018-05-12 DIAGNOSIS — O0990 Supervision of high risk pregnancy, unspecified, unspecified trimester: Secondary | ICD-10-CM

## 2018-05-12 DIAGNOSIS — O4443 Low lying placenta NOS or without hemorrhage, third trimester: Secondary | ICD-10-CM

## 2018-05-12 DIAGNOSIS — Z6830 Body mass index (BMI) 30.0-30.9, adult: Secondary | ICD-10-CM

## 2018-05-12 DIAGNOSIS — Z3A36 36 weeks gestation of pregnancy: Secondary | ICD-10-CM

## 2018-05-12 DIAGNOSIS — O4440 Low lying placenta NOS or without hemorrhage, unspecified trimester: Secondary | ICD-10-CM

## 2018-05-12 DIAGNOSIS — O368390 Maternal care for abnormalities of the fetal heart rate or rhythm, unspecified trimester, not applicable or unspecified: Secondary | ICD-10-CM

## 2018-05-12 NOTE — Progress Notes (Signed)
Routine Prenatal Care Visit  Subjective  Alyssa Duffy is a 22 y.o. G1P0 at [redacted]w[redacted]d being seen today for ongoing prenatal care.  She is currently monitored for the following issues for this low-risk pregnancy and has Supervision of high risk pregnancy, antepartum; Obesity affecting pregnancy; BMI 30.0-30.9,adult; Low-lying placenta; Fetal arrhythmia affecting pregnancy, antepartum; and Elevated blood pressure complicating pregnancy in third trimester, antepartum on their problem list.  ----------------------------------------------------------------------------------- Patient reports no complaints.   Contractions: Irregular. Vag. Bleeding: None.  Movement: Present. Denies leaking of fluid.  Denies HA, visual changes, and RUQ pain. Negative workup at hospital 3 days ago for elevated BPs in clinic.  ----------------------------------------------------------------------------------- The following portions of the patient's history were reviewed and updated as appropriate: allergies, current medications, past family history, past medical history, past social history, past surgical history and problem list. Problem list updated.   Objective  Blood pressure 128/82, weight 170 lb (77.1 kg), last menstrual period 08/03/2017. Pregravid weight 148 lb (67.1 kg) Total Weight Gain 22 lb (9.979 kg) Urinalysis: Urine Protein    Urine Glucose    Fetal Status: Fetal Heart Rate (bpm): 140 Fundal Height: 40 cm Movement: Present     General:  Alert, oriented and cooperative. Patient is in no acute distress.  Skin: Skin is warm and dry. No rash noted.   Cardiovascular: Normal heart rate noted  Respiratory: Normal respiratory effort, no problems with respiration noted  Abdomen: Soft, gravid, appropriate for gestational age. Pain/Pressure: Present     Pelvic:  Cervical exam deferred        Extremities: Normal range of motion.  Edema: Trace  Mental Status: Normal mood and affect. Normal behavior. Normal  judgment and thought content.   Assessment   22 y.o. G1P0 at [redacted]w[redacted]d by  06/03/2018, by Ultrasound presenting for routine prenatal visit  Plan   pregnancy Problems (from 10/04/17 to present)    Problem Noted Resolved   Fetal arrhythmia affecting pregnancy, antepartum 04/05/2018 by Conard Novak, MD No   Overview Addendum 04/25/2018  9:41 AM by Conard Novak, MD    [x]  referral to MFM - evaluated. Not noted on ultrasound with [facility]      Low-lying placenta 01/07/2018 by Conard Novak, MD No   Overview Addendum 03/08/2018 10:16 AM by Conard Novak, MD    [x]  f/u at 28 weeks - resolved      Supervision of high risk pregnancy, antepartum 10/04/2017 by Conard Novak, MD No   Overview Addendum 05/09/2018  4:06 PM by Farrel Conners, CNM    Clinic [facility] Prenatal Labs  Dating  Blood type: O/Positive/-- (03/22 1037)   Genetic Screen 1 Screen:    AFP:     Quad:     NIPS: Antibody:Negative (03/22 1037)  Anatomic [place] complete Rubella: <0.90 (03/22 1037) Varicella: @VZVIGG @  GTT Early:  88              Third trimester: 117 RPR: Non Reactive (08/13 1003)   Rhogam  HBsAg: Negative (03/22 1037)   TDaP vaccine                       Flu Shot: HIV: Non Reactive (08/13 1003)   Baby Food                                GBS:   Contraception  Pap: NILM  CBB   SS: neg  CS/VBAC    Support Person            Obesity affecting pregnancy 10/04/2017 by Conard Novak, MD No   BMI 30.0-30.9,adult 10/04/2017 by Conard Novak, MD No       Preterm labor symptoms and general obstetric precautions including but not limited to vaginal bleeding, contractions, leaking of fluid and fetal movement were reviewed in detail with the patient. Please refer to After Visit Summary for other counseling recommendations.   Return in about 1 week (around 05/19/2018) for Routine Prenatal Appointment.  Thomasene Mohair, MD, [facility], [facility] 05/12/2018 11:06 AM

## 2018-05-19 ENCOUNTER — Encounter: Payer: Self-pay | Admitting: Advanced Practice Midwife

## 2018-05-19 ENCOUNTER — Ambulatory Visit (INDEPENDENT_AMBULATORY_CARE_PROVIDER_SITE_OTHER): Payer: Medicaid Other | Admitting: Advanced Practice Midwife

## 2018-05-19 VITALS — BP 120/66 | Wt 169.5 lb

## 2018-05-19 DIAGNOSIS — O99213 Obesity complicating pregnancy, third trimester: Secondary | ICD-10-CM

## 2018-05-19 DIAGNOSIS — Z3A37 37 weeks gestation of pregnancy: Secondary | ICD-10-CM

## 2018-05-19 DIAGNOSIS — O4443 Low lying placenta NOS or without hemorrhage, third trimester: Secondary | ICD-10-CM

## 2018-05-19 LAB — POCT URINALYSIS DIPSTICK OB
GLUCOSE, UA: NEGATIVE
POC,PROTEIN,UA: NEGATIVE

## 2018-05-19 NOTE — Patient Instructions (Signed)
Vaginal Delivery Vaginal delivery means that you will give birth by pushing your baby out of your birth canal (vagina). A team of health care providers will help you before, during, and after vaginal delivery. Birth experiences are unique for every woman and every pregnancy, and birth experiences vary depending on where you choose to give birth. What should I do to prepare for my baby's birth? Before your baby is born, it is important to talk with your health care provider about:  Your labor and delivery preferences. These may include: ? Medicines that you may be given. ? How you will manage your pain. This might include non-medical pain relief techniques or injectable pain relief such as epidural analgesia. ? How you and your baby will be monitored during labor and delivery. ? Who may be in the labor and delivery room with you. ? Your feelings about surgical delivery of your baby (cesarean delivery, or C-section) if this becomes necessary. ? Your feelings about receiving donated blood through an IV tube (blood transfusion) if this becomes necessary.  Whether you are able: ? To take pictures or videos of the birth. ? To eat during labor and delivery. ? To move around, walk, or change positions during labor and delivery.  What to expect after your baby is born, such as: ? Whether delayed umbilical cord clamping and cutting is offered. ? Who will care for your baby right after birth. ? Medicines or tests that may be recommended for your baby. ? Whether breastfeeding is supported in your hospital or birth center. ? How long you will be in the hospital or birth center.  How any medical conditions you have may affect your baby or your labor and delivery experience.  To prepare for your baby's birth, you should also:  Attend all of your health care visits before delivery (prenatal visits) as recommended by your health care provider. This is important.  Prepare your home for your baby's  arrival. Make sure that you have: ? Diapers. ? Baby clothing. ? Feeding equipment. ? Safe sleeping arrangements for you and your baby.  Install a car seat in your vehicle. Have your car seat checked by a certified car seat installer to make sure that it is installed safely.  Think about who will help you with your new baby at home for at least the first several weeks after delivery.  What can I expect when I arrive at the birth center or hospital? Once you are in labor and have been admitted into the hospital or birth center, your health care provider may:  Review your pregnancy history and any concerns you have.  Insert an IV tube into one of your veins. This is used to give you fluids and medicines.  Check your blood pressure, pulse, temperature, and heart rate (vital signs).  Check whether your bag of water (amniotic sac) has broken (ruptured).  Talk with you about your birth plan and discuss pain control options.  Monitoring Your health care provider may monitor your contractions (uterine monitoring) and your baby's heart rate (fetal monitoring). You may need to be monitored:  Often, but not continuously (intermittently).  All the time or for long periods at a time (continuously). Continuous monitoring may be needed if: ? You are taking certain medicines, such as medicine to relieve pain or make your contractions stronger. ? You have pregnancy or labor complications.  Monitoring may be done by:  Placing a special stethoscope or a handheld monitoring device on your abdomen to   check your baby's heartbeat, and feeling your abdomen for contractions. This method of monitoring does not continuously record your baby's heartbeat or your contractions.  Placing monitors on your abdomen (external monitors) to record your baby's heartbeat and the frequency and length of contractions. You may not have to wear external monitors all the time.  Placing monitors inside of your uterus  (internal monitors) to record your baby's heartbeat and the frequency, length, and strength of your contractions. ? Your health care provider may use internal monitors if he or she needs more information about the strength of your contractions or your baby's heart rate. ? Internal monitors are put in place by passing a thin, flexible wire through your vagina and into your uterus. Depending on the type of monitor, it may remain in your uterus or on your baby's head until birth. ? Your health care provider will discuss the benefits and risks of internal monitoring with you and will ask for your permission before inserting the monitors.  Telemetry. This is a type of continuous monitoring that can be done with external or internal monitors. Instead of having to stay in bed, you are able to move around during telemetry. Ask your health care provider if telemetry is an option for you.  Physical exam Your health care provider may perform a physical exam. This may include:  Checking whether your baby is positioned: ? With the head toward your vagina (head-down). This is most common. ? With the head toward the top of your uterus (head-up or breech). If your baby is in a breech position, your health care provider may try to turn your baby to a head-down position so you can deliver vaginally. If it does not seem that your baby can be born vaginally, your provider may recommend surgery to deliver your baby. In rare cases, you may be able to deliver vaginally if your baby is head-up (breech delivery). ? Lying sideways (transverse). Babies that are lying sideways cannot be delivered vaginally.  Checking your cervix to determine: ? Whether it is thinning out (effacing). ? Whether it is opening up (dilating). ? How low your baby has moved into your birth canal.  What are the three stages of labor and delivery?  Normal labor and delivery is divided into the following three stages: Stage 1  Stage 1 is the  longest stage of labor, and it can last for hours or days. Stage 1 includes: ? Early labor. This is when contractions may be irregular, or regular and mild. Generally, early labor contractions are more than 10 minutes apart. ? Active labor. This is when contractions get longer, more regular, more frequent, and more intense. ? The transition phase. This is when contractions happen very close together, are very intense, and may last longer than during any other part of labor.  Contractions generally feel mild, infrequent, and irregular at first. They get stronger, more frequent (about every 2-3 minutes), and more regular as you progress from early labor through active labor and transition.  Many women progress through stage 1 naturally, but you may need help to continue making progress. If this happens, your health care provider may talk with you about: ? Rupturing your amniotic sac if it has not ruptured yet. ? Giving you medicine to help make your contractions stronger and more frequent.  Stage 1 ends when your cervix is completely dilated to 4 inches (10 cm) and completely effaced. This happens at the end of the transition phase. Stage 2  Once   your cervix is completely effaced and dilated to 4 inches (10 cm), you may start to feel an urge to push. It is common for the body to naturally take a rest before feeling the urge to push, especially if you received an epidural or certain other pain medicines. This rest period may last for up to 1-2 hours, depending on your unique labor experience.  During stage 2, contractions are generally less painful, because pushing helps relieve contraction pain. Instead of contraction pain, you may feel stretching and burning pain, especially when the widest part of your baby's head passes through the vaginal opening (crowning).  Your health care provider will closely monitor your pushing progress and your baby's progress through the vagina during stage 2.  Your  health care provider may massage the area of skin between your vaginal opening and anus (perineum) or apply warm compresses to your perineum. This helps it stretch as the baby's head starts to crown, which can help prevent perineal tearing. ? In some cases, an incision may be made in your perineum (episiotomy) to allow the baby to pass through the vaginal opening. An episiotomy helps to make the opening of the vagina larger to allow more room for the baby to fit through.  It is very important to breathe and focus so your health care provider can control the delivery of your baby's head. Your health care provider may have you decrease the intensity of your pushing, to help prevent perineal tearing.  After delivery of your baby's head, the shoulders and the rest of the body generally deliver very quickly and without difficulty.  Once your baby is delivered, the umbilical cord may be cut right away, or this may be delayed for 1-2 minutes, depending on your baby's health. This may vary among health care providers, hospitals, and birth centers.  If you and your baby are healthy enough, your baby may be placed on your chest or abdomen to help maintain the baby's temperature and to help you bond with each other. Some mothers and babies start breastfeeding at this time. Your health care team will dry your baby and help keep your baby warm during this time.  Your baby may need immediate care if he or she: ? Showed signs of distress during labor. ? Has a medical condition. ? Was born too early (prematurely). ? Had a bowel movement before birth (meconium). ? Shows signs of difficulty transitioning from being inside the uterus to being outside of the uterus. If you are planning to breastfeed, your health care team will help you begin a feeding. Stage 3  The third stage of labor starts immediately after the birth of your baby and ends after you deliver the placenta. The placenta is an organ that develops  during pregnancy to provide oxygen and nutrients to your baby in the womb.  Delivering the placenta may require some pushing, and you may have mild contractions. Breastfeeding can stimulate contractions to help you deliver the placenta.  After the placenta is delivered, your uterus should tighten (contract) and become firm. This helps to stop bleeding in your uterus. To help your uterus contract and to control bleeding, your health care provider may: ? Give you medicine by injection, through an IV tube, by mouth, or through your rectum (rectally). ? Massage your abdomen or perform a vaginal exam to remove any blood clots that are left in your uterus. ? Empty your bladder by placing a thin, flexible tube (catheter) into your bladder. ? Encourage   you to breastfeed your baby. After labor is over, you and your baby will be monitored closely to ensure that you are both healthy until you are ready to go home. Your health care team will teach you how to care for yourself and your baby. This information is not intended to replace advice given to you by your health care provider. Make sure you discuss any questions you have with your health care provider. Document Released: 04/21/2008 Document Revised: 01/31/2016 Document Reviewed: 07/28/2015 Elsevier Interactive Patient Education  2018 [employer]  

## 2018-05-19 NOTE — Progress Notes (Signed)
C/o [email or web address] 

## 2018-05-19 NOTE — Progress Notes (Signed)
Routine Prenatal Care Visit  Subjective  Alyssa Duffy is a 22 y.o. G1P0 at [redacted]w[redacted]d being seen today for ongoing prenatal care.  She is currently monitored for the following issues for this high-risk pregnancy and has Supervision of high risk pregnancy, antepartum; Obesity affecting pregnancy; BMI 30.0-30.9,adult; Low-lying placenta; Fetal arrhythmia affecting pregnancy, antepartum; and Elevated blood pressure complicating pregnancy in third trimester, antepartum on their problem list.  ----------------------------------------------------------------------------------- Patient reports pelvic pressure. She would like to try natural labor. She plans to breastfeed and use Minipill for birth control.   Contractions: Irregular. Vag. Bleeding: None.  Movement: Present. Denies leaking of fluid.  ----------------------------------------------------------------------------------- The following portions of the patient's history were reviewed and updated as appropriate: allergies, current medications, past family history, past medical history, past social history, past surgical history and problem list. Problem list updated.   Objective  Blood pressure 120/66, weight 169 lb 8 oz (76.9 kg), last menstrual period 08/03/2017. Pregravid weight 148 lb (67.1 kg) Total Weight Gain 21 lb 8 oz (9.752 kg) Urinalysis: Urine Protein Negative  Urine Glucose Negative  Fetal Status: Fetal Heart Rate (bpm): 135 Fundal Height: 40 cm Movement: Present  Presentation: Vertex  General:  Alert, oriented and cooperative. Patient is in no acute distress.  Skin: Skin is warm and dry. No rash noted.   Cardiovascular: Normal heart rate noted  Respiratory: Normal respiratory effort, no problems with respiration noted  Abdomen: Soft, gravid, appropriate for gestational age. Pain/Pressure: Present     Pelvic:  Cervical exam performed Dilation: Fingertip Effacement (%): 50 Station: -3  Extremities: Normal range of motion.  Edema:  None  Mental Status: Normal mood and affect. Normal behavior. Normal judgment and thought content.   Assessment   22 y.o. G1P0 at [redacted]w[redacted]d by  06/03/2018, by Ultrasound presenting for routine prenatal visit  Plan   pregnancy Problems (from 10/04/17 to present)    Problem Noted Resolved   Fetal arrhythmia affecting pregnancy, antepartum 04/05/2018 by Conard Novak, MD No   Overview Addendum 04/25/2018  9:41 AM by Conard Novak, MD    [x]  referral to MFM - evaluated. Not noted on ultrasound with [facility]      Low-lying placenta 01/07/2018 by Conard Novak, MD No   Overview Addendum 03/08/2018 10:16 AM by Conard Novak, MD    [x]  f/u at 28 weeks - resolved      Supervision of high risk pregnancy, antepartum 10/04/2017 by Conard Novak, MD No   Overview Addendum 05/09/2018  4:06 PM by Farrel Conners, CNM    Clinic [facility] Prenatal Labs  Dating  Blood type: O/Positive/-- (03/22 1037)   Genetic Screen 1 Screen:    AFP:     Quad:     NIPS: Antibody:Negative (03/22 1037)  Anatomic [place] complete Rubella: <0.90 (03/22 1037) Varicella: @VZVIGG @  GTT Early:  88              Third trimester: 117 RPR: Non Reactive (08/13 1003)   Rhogam  HBsAg: Negative (03/22 1037)   TDaP vaccine                       Flu Shot: HIV: Non Reactive (08/13 1003)   Baby Food                                GBS:   Contraception  Pap: NILM  CBB   SS: neg  CS/VBAC    Support Person            Obesity affecting pregnancy 10/04/2017 by Conard Novak, MD No   BMI 30.0-30.9,adult 10/04/2017 by Conard Novak, MD No       Term labor symptoms and general obstetric precautions including but not limited to vaginal bleeding, contractions, leaking of fluid and fetal movement were reviewed in detail with the patient. Please refer to After Visit Summary for other counseling recommendations.   Return in about 1 week (around 05/26/2018) for rob.  Tresea Mall, CNM 05/19/2018 11:33 AM

## 2018-05-26 ENCOUNTER — Ambulatory Visit (INDEPENDENT_AMBULATORY_CARE_PROVIDER_SITE_OTHER): Payer: Medicaid Other | Admitting: Advanced Practice Midwife

## 2018-05-26 ENCOUNTER — Encounter: Payer: Self-pay | Admitting: Advanced Practice Midwife

## 2018-05-26 VITALS — BP 128/80 | Wt 171.0 lb

## 2018-05-26 DIAGNOSIS — O4443 Low lying placenta NOS or without hemorrhage, third trimester: Secondary | ICD-10-CM

## 2018-05-26 DIAGNOSIS — O368330 Maternal care for abnormalities of the fetal heart rate or rhythm, third trimester, not applicable or unspecified: Secondary | ICD-10-CM

## 2018-05-26 DIAGNOSIS — Z3A38 38 weeks gestation of pregnancy: Secondary | ICD-10-CM

## 2018-05-26 LAB — POCT URINALYSIS DIPSTICK OB
GLUCOSE, UA: NEGATIVE
POC,PROTEIN,UA: NEGATIVE

## 2018-05-26 NOTE — Progress Notes (Signed)
Routine Prenatal Care Visit  Subjective  Alyssa Duffy is a 22 y.o. G1P0 at [redacted]w[redacted]d being seen today for ongoing prenatal care.  She is currently monitored for the following issues for this high-risk pregnancy and has Supervision of high risk pregnancy, antepartum; Obesity affecting pregnancy; BMI 30.0-30.9,adult; Low-lying placenta; Fetal arrhythmia affecting pregnancy, antepartum; and Elevated blood pressure complicating pregnancy in third trimester, antepartum on their problem list.  ----------------------------------------------------------------------------------- Patient reports discomforts of third trimester. She has some cramps but is unsure if they are contractions.   Contractions: Irregular. Vag. Bleeding: None.  Movement: Present. Denies leaking of fluid.  ----------------------------------------------------------------------------------- The following portions of the patient's history were reviewed and updated as appropriate: allergies, current medications, past family history, past medical history, past social history, past surgical history and problem list. Problem list updated.   Objective  Blood pressure 128/80, weight 171 lb (77.6 kg), last menstrual period 08/03/2017. Pregravid weight 148 lb (67.1 kg) Total Weight Gain 23 lb (10.4 kg) Urinalysis: Urine Protein    Urine Glucose    Fetal Status: Fetal Heart Rate (bpm): 170 Fundal Height: 41 cm Movement: Present  Presentation: Vertex  General:  Alert, oriented and cooperative. Patient is in no acute distress.  Skin: Skin is warm and dry. No rash noted.   Cardiovascular: Normal heart rate noted  Respiratory: Normal respiratory effort, no problems with respiration noted  Abdomen: Soft, gravid, appropriate for gestational age. Pain/Pressure: Present     Pelvic:  Cervical sweep Dilation: 1.5 Effacement (%): 80 Station: -2  Extremities: Normal range of motion.  Edema: None  Mental Status: Normal mood and affect. Normal behavior.  Normal judgment and thought content.   Assessment   22 y.o. G1P0 at [redacted]w[redacted]d by  06/03/2018, by Ultrasound presenting for routine prenatal visit  Plan   pregnancy Problems (from 10/04/17 to present)    Problem Noted Resolved   Fetal arrhythmia affecting pregnancy, antepartum 04/05/2018 by Conard Novak, MD No   Overview Addendum 04/25/2018  9:41 AM by Conard Novak, MD    [x]  referral to MFM - evaluated. Not noted on ultrasound with [facility]      Low-lying placenta 01/07/2018 by Conard Novak, MD No   Overview Addendum 03/08/2018 10:16 AM by Conard Novak, MD    [x]  f/u at 28 weeks - resolved      Supervision of high risk pregnancy, antepartum 10/04/2017 by Conard Novak, MD No   Overview Addendum 05/09/2018  4:06 PM by Farrel Conners, CNM    Clinic [facility] Prenatal Labs  Dating  Blood type: O/Positive/-- (03/22 1037)   Genetic Screen 1 Screen:    AFP:     Quad:     NIPS: Antibody:Negative (03/22 1037)  Anatomic [place] complete Rubella: <0.90 (03/22 1037) Varicella: @VZVIGG @  GTT Early:  88              Third trimester: 117 RPR: Non Reactive (08/13 1003)   Rhogam  HBsAg: Negative (03/22 1037)   TDaP vaccine                       Flu Shot: HIV: Non Reactive (08/13 1003)   Baby Food                                GBS:   Contraception  Pap: NILM  CBB   SS: neg  CS/VBAC    Support Person  Obesity affecting pregnancy 10/04/2017 by Conard Novak, MD No   BMI 30.0-30.9,adult 10/04/2017 by Conard Novak, MD No       Term labor symptoms and general obstetric precautions including but not limited to vaginal bleeding, contractions, leaking of fluid and fetal movement were reviewed in detail with the patient.   Return in about 1 week (around 06/02/2018) for rob.  Tresea Mall, CNM 05/26/2018 11:08 AM

## 2018-05-26 NOTE — Addendum Note (Signed)
Addended by: Cornelius Moras D on: 05/26/2018 11:21 AM   Modules accepted: Orders

## 2018-06-02 ENCOUNTER — Ambulatory Visit (INDEPENDENT_AMBULATORY_CARE_PROVIDER_SITE_OTHER): Payer: Medicaid Other | Admitting: Obstetrics and Gynecology

## 2018-06-02 ENCOUNTER — Encounter: Payer: Self-pay | Admitting: Obstetrics and Gynecology

## 2018-06-02 ENCOUNTER — Encounter: Payer: Self-pay | Admitting: Anesthesiology

## 2018-06-02 ENCOUNTER — Inpatient Hospital Stay
Admission: EM | Admit: 2018-06-02 | Discharge: 2018-06-04 | DRG: 807 | Disposition: A | Discharge status: Home / Self Care | Payer: Medicaid Other | Attending: Obstetrics & Gynecology | Admitting: Obstetrics & Gynecology

## 2018-06-02 ENCOUNTER — Other Ambulatory Visit: Payer: Self-pay

## 2018-06-02 VITALS — BP 140/72 | Wt 168.5 lb

## 2018-06-02 DIAGNOSIS — O368390 Maternal care for abnormalities of the fetal heart rate or rhythm, unspecified trimester, not applicable or unspecified: Secondary | ICD-10-CM

## 2018-06-02 DIAGNOSIS — O163 Unspecified maternal hypertension, third trimester: Secondary | ICD-10-CM | POA: Diagnosis present

## 2018-06-02 DIAGNOSIS — O134 Gestational [pregnancy-induced] hypertension without significant proteinuria, complicating childbirth: Secondary | ICD-10-CM | POA: Diagnosis present

## 2018-06-02 DIAGNOSIS — O9902 Anemia complicating childbirth: Secondary | ICD-10-CM | POA: Diagnosis present

## 2018-06-02 DIAGNOSIS — O99214 Obesity complicating childbirth: Secondary | ICD-10-CM | POA: Diagnosis present

## 2018-06-02 DIAGNOSIS — E669 Obesity, unspecified: Secondary | ICD-10-CM | POA: Diagnosis present

## 2018-06-02 DIAGNOSIS — O0990 Supervision of high risk pregnancy, unspecified, unspecified trimester: Secondary | ICD-10-CM

## 2018-06-02 DIAGNOSIS — O4443 Low lying placenta NOS or without hemorrhage, third trimester: Secondary | ICD-10-CM

## 2018-06-02 DIAGNOSIS — Z3A39 39 weeks gestation of pregnancy: Secondary | ICD-10-CM

## 2018-06-02 DIAGNOSIS — O133 Gestational [pregnancy-induced] hypertension without significant proteinuria, third trimester: Secondary | ICD-10-CM | POA: Insufficient documentation

## 2018-06-02 DIAGNOSIS — O4440 Low lying placenta NOS or without hemorrhage, unspecified trimester: Secondary | ICD-10-CM

## 2018-06-02 DIAGNOSIS — O99213 Obesity complicating pregnancy, third trimester: Secondary | ICD-10-CM

## 2018-06-02 DIAGNOSIS — Z3490 Encounter for supervision of normal pregnancy, unspecified, unspecified trimester: Secondary | ICD-10-CM | POA: Diagnosis present

## 2018-06-02 DIAGNOSIS — Z6830 Body mass index (BMI) 30.0-30.9, adult: Secondary | ICD-10-CM

## 2018-06-02 DIAGNOSIS — D509 Iron deficiency anemia, unspecified: Secondary | ICD-10-CM | POA: Diagnosis present

## 2018-06-02 LAB — CBC
HCT: 33.5 % — ABNORMAL LOW (ref 36.0–46.0)
HEMOGLOBIN: 10.7 g/dL — AB (ref 12.0–15.0)
MCH: 28.1 pg (ref 26.0–34.0)
MCHC: 31.9 g/dL (ref 30.0–36.0)
MCV: 87.9 fL (ref 80.0–100.0)
PLATELETS: 208 10*3/uL (ref 150–400)
RBC: 3.81 MIL/uL — AB (ref 3.87–5.11)
RDW: 15.9 % — ABNORMAL HIGH (ref 11.5–15.5)
WBC: 12.1 10*3/uL — ABNORMAL HIGH (ref 4.0–10.5)
nRBC: 0 % (ref 0.0–0.2)

## 2018-06-02 LAB — COMPREHENSIVE METABOLIC PANEL
ALBUMIN: 2.4 g/dL — AB (ref 3.5–5.0)
ALK PHOS: 260 U/L — AB (ref 38–126)
ALT: 12 U/L (ref 0–44)
AST: 22 U/L (ref 15–41)
Anion gap: 8 (ref 5–15)
BILIRUBIN TOTAL: 0.7 mg/dL (ref 0.3–1.2)
BUN: <5 mg/dL — ABNORMAL LOW (ref 6–20)
CALCIUM: 8.6 mg/dL — AB (ref 8.9–10.3)
CO2: 23 mmol/L (ref 22–32)
Chloride: 108 mmol/L (ref 98–111)
Creatinine, Ser: 0.48 mg/dL (ref 0.44–1.00)
GFR calc Af Amer: >60 mL/min (ref >60)
GFR calc non Af Amer: >60 mL/min (ref >60)
Glucose, Bld: 78 mg/dL (ref 70–99)
Potassium: 3.8 mmol/L (ref 3.5–5.1)
SODIUM: 139 mmol/L (ref 135–145)
TOTAL PROTEIN: 6.4 g/dL — AB (ref 6.5–8.1)

## 2018-06-02 LAB — TYPE AND SCREEN
ABO/RH(D): O POS
Antibody Screen: NEGATIVE

## 2018-06-02 LAB — PROTEIN / CREATININE RATIO, URINE
Creatinine, Urine: 33 mg/dL
Protein Creatinine Ratio: 0.76 mg/mg{Cre} — ABNORMAL HIGH (ref 0.00–0.15)
Total Protein, Urine: 25 mg/dL

## 2018-06-02 MED ORDER — FENTANYL 2.5 MCG/ML W/ROPIVACAINE 0.15% IN NS 100 ML EPIDURAL (ARMC)
EPIDURAL | Status: AC
  Filled 2018-06-02: qty 100

## 2018-06-02 MED ORDER — ONDANSETRON HCL 4 MG/2ML IJ SOLN: 4.0000 mg | INTRAMUSCULAR | Status: DC | PRN

## 2018-06-02 MED ORDER — SODIUM CHLORIDE 0.9% FLUSH: 3.0000 mL | INTRAVENOUS | Status: DC | PRN

## 2018-06-02 MED ORDER — ONDANSETRON HCL 4 MG/2ML IJ SOLN: 4.0000 mg | Freq: Four times a day (QID) | INTRAMUSCULAR | Status: DC | PRN

## 2018-06-02 MED ORDER — METHYLERGONOVINE MALEATE 0.2 MG/ML IJ SOLN
INTRAMUSCULAR | Status: AC
  Filled 2018-06-02: qty 1

## 2018-06-02 MED ORDER — FENTANYL CITRATE (PF) 100 MCG/2ML IJ SOLN
50.0000 ug | INTRAMUSCULAR | Status: DC | PRN
  Administered 2018-06-02: 50 ug via INTRAVENOUS
  Filled 2018-06-02: qty 2

## 2018-06-02 MED ORDER — OXYCODONE-ACETAMINOPHEN 5-325 MG PO TABS: 1.0000 | ORAL_TABLET | ORAL | Status: DC | PRN

## 2018-06-02 MED ORDER — COCONUT OIL OIL
1.0000 "application " | TOPICAL_OIL | Status: DC | PRN
  Filled 2018-06-02 (×2): qty 120

## 2018-06-02 MED ORDER — TERBUTALINE SULFATE 1 MG/ML IJ SOLN: 0.2500 mg | Freq: Once | INTRAMUSCULAR | Status: DC | PRN

## 2018-06-02 MED ORDER — OXYTOCIN 40 UNITS IN LACTATED RINGERS INFUSION - SIMPLE MED
1.0000 m[IU]/min | INTRAVENOUS | Status: DC
  Administered 2018-06-02: 2 m[IU]/min via INTRAVENOUS

## 2018-06-02 MED ORDER — OXYTOCIN BOLUS FROM INFUSION: 500.0000 mL | Freq: Once | INTRAVENOUS | Status: DC

## 2018-06-02 MED ORDER — SODIUM CHLORIDE 0.9 % IV SOLN: 250.0000 mL | INTRAVENOUS | Status: DC | PRN

## 2018-06-02 MED ORDER — SENNOSIDES-DOCUSATE SODIUM 8.6-50 MG PO TABS: 2.0000 | ORAL_TABLET | ORAL | Status: DC

## 2018-06-02 MED ORDER — FENTANYL CITRATE (PF) 100 MCG/2ML IJ SOLN
INTRAMUSCULAR | Status: AC
  Filled 2018-06-02: qty 2

## 2018-06-02 MED ORDER — VARICELLA VIRUS VACCINE LIVE 1350 PFU/0.5ML IJ SUSR
0.5000 mL | Freq: Once | INTRAMUSCULAR | Status: DC
  Filled 2018-06-02: qty 0.5

## 2018-06-02 MED ORDER — AMMONIA AROMATIC IN INHA
RESPIRATORY_TRACT | Status: AC
  Filled 2018-06-02: qty 10

## 2018-06-02 MED ORDER — WITCH HAZEL-GLYCERIN EX PADS: 1.0000 "application " | MEDICATED_PAD | CUTANEOUS | Status: DC | PRN

## 2018-06-02 MED ORDER — SODIUM CHLORIDE 0.9% FLUSH: 3.0000 mL | Freq: Two times a day (BID) | INTRAVENOUS | Status: DC

## 2018-06-02 MED ORDER — IBUPROFEN 600 MG PO TABS
600.0000 mg | ORAL_TABLET | Freq: Four times a day (QID) | ORAL | Status: DC
  Administered 2018-06-02: 600 mg via ORAL
  Filled 2018-06-02: qty 1

## 2018-06-02 MED ORDER — LIDOCAINE HCL (PF) 1 % IJ SOLN
INTRAMUSCULAR | Status: AC
  Filled 2018-06-02: qty 30

## 2018-06-02 MED ORDER — BENZOCAINE-MENTHOL 20-0.5 % EX AERO
1.0000 "application " | INHALATION_SPRAY | CUTANEOUS | Status: DC | PRN
  Filled 2018-06-02: qty 56

## 2018-06-02 MED ORDER — ZOLPIDEM TARTRATE 5 MG PO TABS: 5.0000 mg | ORAL_TABLET | Freq: Every evening | ORAL | Status: DC | PRN

## 2018-06-02 MED ORDER — LACTATED RINGERS IV SOLN
INTRAVENOUS | Status: DC
  Administered 2018-06-02: 13:00:00 via INTRAVENOUS

## 2018-06-02 MED ORDER — MISOPROSTOL 200 MCG PO TABS
ORAL_TABLET | ORAL | Status: AC
  Filled 2018-06-02: qty 4

## 2018-06-02 MED ORDER — CARBOPROST TROMETHAMINE 250 MCG/ML IM SOLN
INTRAMUSCULAR | Status: AC
  Filled 2018-06-02: qty 1

## 2018-06-02 MED ORDER — LACTATED RINGERS IV SOLN: 500.0000 mL | INTRAVENOUS | Status: DC | PRN

## 2018-06-02 MED ORDER — OXYTOCIN 40 UNITS IN LACTATED RINGERS INFUSION - SIMPLE MED: 2.5000 [IU]/h | INTRAVENOUS | Status: DC

## 2018-06-02 MED ORDER — OXYTOCIN 40 UNITS IN LACTATED RINGERS INFUSION - SIMPLE MED
INTRAVENOUS | Status: AC
  Filled 2018-06-02: qty 1000

## 2018-06-02 MED ORDER — MEASLES, MUMPS & RUBELLA VAC IJ SOLR
0.5000 mL | Freq: Once | INTRAMUSCULAR | Status: DC
  Filled 2018-06-02: qty 0.5

## 2018-06-02 MED ORDER — ONDANSETRON HCL 4 MG PO TABS: 4.0000 mg | ORAL_TABLET | ORAL | Status: DC | PRN

## 2018-06-02 MED ORDER — OXYCODONE-ACETAMINOPHEN 5-325 MG PO TABS: 2.0000 | ORAL_TABLET | ORAL | Status: DC | PRN

## 2018-06-02 MED ORDER — DIPHENHYDRAMINE HCL 25 MG PO CAPS: 25.0000 mg | ORAL_CAPSULE | Freq: Four times a day (QID) | ORAL | Status: DC | PRN

## 2018-06-02 MED ORDER — SIMETHICONE 80 MG PO CHEW: 80.0000 mg | CHEWABLE_TABLET | ORAL | Status: DC | PRN

## 2018-06-02 MED ORDER — DIBUCAINE 1 % RE OINT: 1.0000 "application " | TOPICAL_OINTMENT | RECTAL | Status: DC | PRN

## 2018-06-02 MED ORDER — ACETAMINOPHEN 325 MG PO TABS: 650.0000 mg | ORAL_TABLET | ORAL | Status: DC | PRN

## 2018-06-02 MED ORDER — SENNOSIDES-DOCUSATE SODIUM 8.6-50 MG PO TABS
2.0000 | ORAL_TABLET | ORAL | Status: DC
  Administered 2018-06-03 – 2018-06-04 (×2): 2 via ORAL
  Filled 2018-06-02 (×2): qty 2

## 2018-06-02 MED ORDER — IBUPROFEN 600 MG PO TABS
600.0000 mg | ORAL_TABLET | Freq: Four times a day (QID) | ORAL | Status: DC
  Administered 2018-06-03 – 2018-06-04 (×7): 600 mg via ORAL
  Filled 2018-06-02 (×7): qty 1

## 2018-06-02 NOTE — Anesthesia Preprocedure Evaluation (Deleted)
Anesthesia Evaluation  Patient identified by MRN, date of birth, ID band Patient awake    Reviewed: Allergy & Precautions, NPO status , Patient's Chart, lab work & pertinent test results  Airway Mallampati: II  TM Distance: >3 FB     Dental   Pulmonary neg pulmonary ROS,    Pulmonary exam normal        Cardiovascular hypertension, Normal cardiovascular exam     Neuro/Psych negative neurological ROS  negative psych ROS   GI/Hepatic negative GI ROS, Neg liver ROS,   Endo/Other  negative endocrine ROS  Renal/GU negative Renal ROS  negative genitourinary   Musculoskeletal negative musculoskeletal ROS (+)   Abdominal   Peds negative pediatric ROS (+)  Hematology negative hematology ROS (+)   Anesthesia Other Findings   Reproductive/Obstetrics (+) Pregnancy                             Anesthesia Physical Anesthesia Plan  ASA: II  Anesthesia Plan: Epidural   Post-op Pain Management:    Induction:   PONV Risk Score and Plan:   Airway Management Planned: Natural Airway  Additional Equipment:   Intra-op Plan:   Post-operative Plan:   Informed Consent: I have reviewed the patients History and Physical, chart, labs and discussed the procedure including the risks, benefits and alternatives for the proposed anesthesia with the patient or authorized representative who has indicated his/her understanding and acceptance.   Dental advisory given  Plan Discussed with: CRNA and Surgeon  Anesthesia Plan Comments:         Anesthesia Quick Evaluation

## 2018-06-02 NOTE — Discharge Summary (Addendum)
OB Discharge Summary     Patient Name: Alyssa Duffy DOB: Sep 17, 1995 MRN: [id number]  Date of admission: 06/02/2018 Delivering MD: Letitia Libra, MD  Date of Delivery: 06/02/2018  Date of discharge: 06/04/2018  Admitting diagnosis: 39 wks preg Intrauterine pregnancy: [redacted]w[redacted]d     Secondary diagnosis: Gestational Hypertension     Discharge diagnosis: Term Pregnancy Delivered, Gestational hypertension                         Hospital course:  Induction of Labor With Vaginal Delivery   22 y.o. yo G1P1001 at [redacted]w[redacted]d was admitted to the hospital 06/02/2018 for induction of labor.  Indication for induction: Favorable cervix at term and Gestational hypertension.  Patient had an uncomplicated labor course as follows: Membrane Rupture Time/Date: 3:38 PM ,06/02/2018   Intrapartum Procedures: Episiotomy:                                           Lacerations:  1st degree [2]  Patient had delivery of a Viable infant.  Information for the patient's newborn:  Belvie, Iribe [[id number]]  Delivery Method: Vag-Spont   06/02/2018  Details of delivery can be found in separate delivery note.  Patient had a routine postpartum course. Patient is discharged home 06/04/18.                                                                 Post partum procedures:none  Complications: None  Physical exam on 06/04/2018: Vitals:   06/03/18 1136 06/03/18 1555 06/03/18 2300 06/04/18 0735  BP: 134/70 (!) 106/58 123/68 121/68  Pulse: 84 90 88 72  Resp: 18 18 18 20   Temp: (!) 97.3 F (36.3 C) 97.6 F (36.4 C) 97.7 F (36.5 C) 98 F (36.7 C)  TempSrc: Oral Oral Oral Oral  SpO2: 100% 99% 100% 99%  Weight:      Height:       General: alert, cooperative and no distress Lochia: appropriate Uterine Fundus: firm Incision: N/A DVT Evaluation: No evidence of DVT seen on physical exam.  Labs: Lab Results  Component Value Date   WBC 16.4 (H) 06/03/2018   HGB 9.3 (L) 06/03/2018   HCT 28.7 (L) 06/03/2018    MCV 87.2 06/03/2018   PLT 207 06/03/2018   CMP Latest Ref Rng & Units 06/02/2018  Glucose 70 - 99 mg/dL 78  BUN 6 - 20 mg/dL <7(W)  Creatinine 2.95 - 1.00 mg/dL 6.21  Sodium 308 - 657 mmol/L 139  Potassium 3.5 - 5.1 mmol/L 3.8  Chloride 98 - 111 mmol/L 108  CO2 22 - 32 mmol/L 23  Calcium 8.9 - 10.3 mg/dL 8.4(O)  Total Protein 6.5 - 8.1 g/dL 6.4(L)  Total Bilirubin 0.3 - 1.2 mg/dL 0.7  Alkaline Phos 38 - 126 U/L 260(H)  AST 15 - 41 U/L 22  ALT 0 - 44 U/L 12    Discharge instruction: per After Visit Summary.  Medications:  Allergies as of 06/04/2018      Reactions   Pecan Pollen Other (See Comments)   Allergies x's 1,000 per pt      Medication List  TAKE these medications   FUSION PLUS Caps Take 1 capsule by mouth daily.   hydrOXYzine 25 MG tablet Commonly known as:  ATARAX/VISTARIL Take 1 tablet (25 mg total) by mouth every 6 (six) hours as needed for anxiety.   norethindrone 0.35 MG tablet Commonly known as:  MICRONOR,CAMILA,ERRIN Take 1 tablet (0.35 mg total) by mouth daily. Start taking on:  06/19/2018   Tdap 5-2.5-18.5 LF-MCG/0.5 injection Commonly known as:  BOOSTRIX Inject 0.5 mLs into the muscle once for 1 dose.       Diet: routine diet  Activity: Advance as tolerated. Pelvic rest for 6 weeks.   Outpatient follow up: Follow-up Information    Nadara Mustard, MD. Schedule an appointment as soon as possible for a visit in 6 week(s).   Specialty:  Obstetrics and Gynecology Why:  Post Partum Check Up Contact information: [address] [place] [address] [phone]             Postpartum contraception: Progesterone only pills Rhogam Given postpartum: NA Rubella vaccine given postpartum: will get at [facility] Varicella vaccine given postpartum: will get at health department TDaP given antepartum or postpartum: Yes  Newborn Data: Live born female  Birth Weight:  3700 g APGAR: 9, 9  Newborn Delivery   Birth date/time:   06/02/2018 18:41:00 Delivery type:  Vaginal, Spontaneous      Baby Feeding: Breast  Disposition:home with mother  SIGNED:  Tresea Mall, CNM 06/04/2018 10:56 AM

## 2018-06-02 NOTE — Progress Notes (Signed)
Routine Prenatal Care Visit  Subjective  Alyssa Duffy is a 22 y.o. G1P0 at [redacted]w[redacted]d being seen today for ongoing prenatal care.  She is currently monitored for the following issues for this high-risk pregnancy and has Supervision of high risk pregnancy, antepartum; Obesity affecting pregnancy; BMI 30.0-30.9,adult; Low-lying placenta; Fetal arrhythmia affecting pregnancy, antepartum; and Elevated blood pressure complicating pregnancy in third trimester, antepartum on their problem list.  ----------------------------------------------------------------------------------- Patient reports pelvic pressure.   Contractions: Irregular. Vag. Bleeding: Small.  Movement: Present. Denies leaking of fluid.  ----------------------------------------------------------------------------------- The following portions of the patient's history were reviewed and updated as appropriate: allergies, current medications, past family history, past medical history, past social history, past surgical history and problem list. Problem list updated.   Objective  Blood pressure 140/72, weight 168 lb 8 oz (76.4 kg), last menstrual period 08/03/2017. Pregravid weight 148 lb (67.1 kg) Total Weight Gain 20 lb 8 oz (9.299 kg) Urinalysis:      Fetal Status: Fetal Heart Rate (bpm): 160 Fundal Height: 42 cm Movement: Present  Presentation: Vertex  General:  Alert, oriented and cooperative. Patient is in no acute distress.  Skin: Skin is warm and dry. No rash noted.   Cardiovascular: Normal heart rate noted  Respiratory: Normal respiratory effort, no problems with respiration noted  Abdomen: Soft, gravid, appropriate for gestational age. Pain/Pressure: Present     Pelvic:  Cervical exam performed Dilation: 3 Effacement (%): 80 Station: -3  Extremities: Normal range of motion.     Mental Status: Normal mood and affect. Normal behavior. Normal judgment and thought content.     Assessment   22 y.o. G1P0 at [redacted]w[redacted]d by   06/03/2018, by Ultrasound presenting for routine prenatal visit  Plan   pregnancy Problems (from 10/04/17 to present)    Problem Noted Resolved   Fetal arrhythmia affecting pregnancy, antepartum 04/05/2018 by Conard Novak, MD No   Overview Addendum 04/25/2018  9:41 AM by Conard Novak, MD    [x]  referral to MFM - evaluated. Not noted on ultrasound with [facility]      Low-lying placenta 01/07/2018 by Conard Novak, MD No   Overview Addendum 03/08/2018 10:16 AM by Conard Novak, MD    [x]  f/u at 28 weeks - resolved      Supervision of high risk pregnancy, antepartum 10/04/2017 by Conard Novak, MD No   Overview Addendum 06/02/2018 12:09 PM by Natale Milch, MD    Clinic [facility] Prenatal Labs  Dating  7 wk [place] Blood type: O/Positive/-- (03/22 1037)   Genetic Screen  not needed Antibody:Negative (03/22 1037)  Anatomic [place] complete Rubella: <0.90 (03/22 1037)N-Immune Varicella: NonImmune  GTT Early:  88              Third trimester: 117 RPR:    Rhogam  not needed HBsAg: Negative (03/22 1037)   TDaP vaccine                        Flu Shot: 03/12/18 HIV: Non Reactive (08/13 1003)   Baby Food                                GBS:   Contraception  Pap: NILM  CBB   SS: neg  CS/VBAC    Support Person            Obesity affecting pregnancy 10/04/2017 by Conard Novak,  MD No   BMI 30.0-30.9,adult 10/04/2017 by Conard Novak, MD No       Gestational age appropriate obstetric precautions including but not limited to vaginal bleeding, contractions, leaking of fluid and fetal movement were reviewed in detail with the patient.    Elevated blood pressures today in office and suspected macrosomia infant. Will send for IOL today.   Return in about 1 week (around 06/09/2018) for ROB.  Natale Milch MD [facility], [facility] 06/02/2018, 12:09 PM

## 2018-06-02 NOTE — Progress Notes (Signed)
ROB C/o pressure Desires cervical check

## 2018-06-02 NOTE — Discharge Instructions (Signed)
Vaginal Delivery, Care After °Refer to this sheet in the next few weeks. These instructions provide you with information about caring for yourself after vaginal delivery. Your health care provider may also give you more specific instructions. Your treatment has been planned according to current medical practices, but problems sometimes occur. Call your health care provider if you have any problems or questions. °What can I expect after the procedure? °After vaginal delivery, it is common to have: °· Some bleeding from your vagina. °· Soreness in your abdomen, your vagina, and the area of skin between your vaginal opening and your anus (perineum). °· Pelvic cramps. °· Fatigue. ° °Follow these instructions at home: °Medicines °· Take over-the-counter and prescription medicines only as told by your health care provider. °· If you were prescribed an antibiotic medicine, take it as told by your health care provider. Do not stop taking the antibiotic until it is finished. °Driving ° °· Do not drive or operate heavy machinery while taking prescription pain medicine. °· Do not drive for 24 hours if you received a sedative. °Lifestyle °· Do not drink alcohol. This is especially important if you are breastfeeding or taking medicine to relieve pain. °· Do not use tobacco products, including cigarettes, chewing tobacco, or e-cigarettes. If you need help quitting, ask your health care provider. °Eating and drinking °· Drink at least 8 eight-ounce glasses of water every day unless you are told not to by your health care provider. If you choose to breastfeed your baby, you may need to drink more water than this. °· Eat high-fiber foods every day. These foods may help prevent or relieve constipation. High-fiber foods include: °? Whole grain cereals and breads. °? Brown rice. °? Beans. °? Fresh fruits and vegetables. °Activity °· Return to your normal activities as told by your health care provider. Ask your health care provider  what activities are safe for you. °· Rest as much as possible. Try to rest or take a nap when your baby is sleeping. °· Do not lift anything that is heavier than your baby or 10 lb (4.5 kg) until your health care provider says that it is safe. °· Talk with your health care provider about when you can engage in sexual activity. This may depend on your: °? Risk of infection. °? Rate of healing. °? Comfort and desire to engage in sexual activity. °Vaginal Care °· If you have an episiotomy or a vaginal tear, check the area every day for signs of infection. Check for: °? More redness, swelling, or pain. °? More fluid or blood. °? Warmth. °? Pus or a bad smell. °· Do not use tampons or douches until your health care provider says this is safe. °· Watch for any blood clots that may pass from your vagina. These may look like clumps of dark red, brown, or black discharge. °General instructions °· Keep your perineum clean and dry as told by your health care provider. °· Wear loose, comfortable clothing. °· Wipe from front to back when you use the toilet. °· Ask your health care provider if you can shower or take a bath. If you had an episiotomy or a perineal tear during labor and delivery, your health care provider may tell you not to take baths for a certain length of time. °· Wear a bra that supports your breasts and fits you well. °· If possible, have someone help you with household activities and help care for your baby for at least a few days after   you leave the hospital. °· Keep all follow-up visits for you and your baby as told by your health care provider. This is important. °Contact a health care provider if: °· You have: °? Vaginal discharge that has a bad smell. °? Difficulty urinating. °? Pain when urinating. °? A sudden increase or decrease in the frequency of your bowel movements. °? More redness, swelling, or pain around your episiotomy or vaginal tear. °? More fluid or blood coming from your episiotomy or  vaginal tear. °? Pus or a bad smell coming from your episiotomy or vaginal tear. °? A fever. °? A rash. °? Little or no interest in activities you used to enjoy. °? Questions about caring for yourself or your baby. °· Your episiotomy or vaginal tear feels warm to the touch. °· Your episiotomy or vaginal tear is separating or does not appear to be healing. °· Your breasts are painful, hard, or turn red. °· You feel unusually sad or worried. °· You feel nauseous or you vomit. °· You pass large blood clots from your vagina. If you pass a blood clot from your vagina, save it to show to your health care provider. Do not flush blood clots down the toilet without having your health care provider look at them. °· You urinate more than usual. °· You are dizzy or light-headed. °· You have not breastfed at all and you have not had a menstrual period for 12 weeks after delivery. °· You have stopped breastfeeding and you have not had a menstrual period for 12 weeks after you stopped breastfeeding. °Get help right away if: °· You have: °? Pain that does not go away or does not get better with medicine. °? Chest pain. °? Difficulty breathing. °? Blurred vision or spots in your vision. °? Thoughts about hurting yourself or your baby. °· You develop pain in your abdomen or in one of your legs. °· You develop a severe headache. °· You faint. °· You bleed from your vagina so much that you fill two sanitary pads in one hour. °This information is not intended to replace advice given to you by your health care provider. Make sure you discuss any questions you have with your health care provider. °Document Released: 07/10/2000 Document Revised: 12/25/2015 Document Reviewed: 07/28/2015 °Elsevier Interactive Patient Education © 2018 [employer] ° °

## 2018-06-02 NOTE — Progress Notes (Signed)
  Labor Progress Note   22 y.o. G1P0 @ [redacted]w[redacted]d , admitted for  Pregnancy, Labor Management.   Subjective:  Pain w ctxs Denies h/a, blurry vision, CP, SOB, edema  Objective:  BP (!) 150/80   Pulse (!) 106   Temp 98.4 F (36.9 C) (Oral)   Resp 18   Ht 4\' 11"  (1.499 m)   Wt 76.2 kg   LMP 08/03/2017   BMI 33.93 kg/m  Abd: gravid NT Extr: trace to 1+ bilateral pedal edema SVE: 5/80/-2  EFM: FHR: 140 bpm, variability: moderate,  accelerations:  Present,  decelerations:  Absent Toco: Frequency: Every 3-4 minutes Labs: I have reviewed the patient's lab results.  Assessment & Plan:  G1P0 @ [redacted]w[redacted]d, admitted for  Pregnancy and Labor/Delivery Management  1. Pain management: none. 2. FWB: FHT category 1.  3. ID: GBS negative 4. Labor management: AROM clear.  Cont Pitocin  All discussed with patient, see orders  Annamarie Major, MD, [facility], [facility] 06/02/2018  3:42 PM

## 2018-06-02 NOTE — H&P (Signed)
Obstetrics Admission History & Physical   Induction of labor for gestational hypertension   HPI:  22 y.o. G1P0 @ [redacted]w[redacted]d (06/03/2018, by Ultrasound). Admitted on 06/02/2018:   Patient Active Problem List   Diagnosis Date Noted  . Gestational hypertension, third trimester 06/02/2018  . Encounter for planned induction of labor 06/02/2018  . Elevated blood pressure complicating pregnancy in third trimester, antepartum 05/09/2018  . Fetal arrhythmia affecting pregnancy, antepartum 04/05/2018  . Low-lying placenta 01/07/2018  . Supervision of high risk pregnancy, antepartum 10/04/2017  . Obesity affecting pregnancy 10/04/2017  . BMI 30.0-30.9,adult 10/04/2017     Presents for elevated blood pressures in clinic today: 140/72 and 136/90. First reading on arrival in Labor and Delivery also elevated at 144/78. She previously  had some elevated BP readings at 36 weeks but then was normotensive until now. Denies visual changes and epigastric pain. Has a mild intermittent frontal headache. Good fetal movement. No loss of fluid. Slight vaginal spotting after exam in office earlier.  P[facility]atal care at: at [facility]. Pregnancy complicated by gestational HTN.  ROS: A review of systems was performed and negative, except as stated in the above HPI. PMHx:  Past Medical History:  Diagnosis Date  . Medical history non-contributory    PSHx: History reviewed. No pertinent surgical history. Medications:  Medications Prior to Admission  Medication Sig Dispense Refill Last Dose  . Iron-FA-B Cmp-C-Biot-Probiotic (FUSION PLUS) CAPS Take 1 capsule by mouth daily.   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 OBHx:  OB History  Gravida Para Term Preterm AB Living  1            SAB TAB Ectopic Multiple Live Births               # Outcome Date GA Lbr Len/2nd Weight Sex Delivery Anes PTL Lv  1 Current            FHx: Maternal aunt with diabetes, type II.  No family history of birth  defects. Soc Hx: Never smoker, Alcohol: none, no history of substance abuse.  Objective:   Vitals:   06/02/18 1237  BP: (!) 144/78  Pulse: (!) 105  Resp: 18  Temp: 98.4 F (36.9 C)   Constitutional: Well nourished, well developed female in no acute distress.  HEENT: Normal Skin: Warm and dry.  Cardiovascular: Tachycardia Extremity: Trace to 1+ bilateral pedal edema Respiratory: Clear to auscultation bilaterally. Normal respiratory effort Abdomen: Gravid, non-tender Neuro: Cranial nerves grossly intact Psych: Alert and Oriented x3. No memory deficits. Normal mood and affect.  MS: normal gait, normal bilateral lower extremity ROM/strength/stability.  Pelvic exam: is not limited by body habitus External Genitalia, Bartholin's glands, Urethra, Skene's glands: within normal limits Vagina: within normal limits and with small amount of blood in the vault Cervix: 3.5/80/-2 Uterus: Spontaneous uterine activity, occasional contractions  EFM: FHR: 145 bpm, variability: moderate,  accelerations:  Present,  decelerations:  Absent Toco: Occasional contractions   Perinatal info:  Blood type: O positive Rubella - Not immune Varicella - Not immune TDaP - has not received this pregnancy RPR NR / HIV Neg/ HBsAg Neg   Assessment & Plan:   22 y.o. G1P0 @ [redacted]w[redacted]d, Admitted on 06/02/2018 for an induction of labor at term for gestational hypertension.    Admit for labor, Observe for cervical change, Fetal Wellbeing Reassuring, Epidural when ready, AROM when Appropriate and GBS status negative, treat as needed   CBC, CMP and protein creatinine ratio to  evaluate for presence of pre-eclampsia.  Marcelyn Bruins, CNM [facility], [facility] 06/02/2018  1:26 PM

## 2018-06-03 ENCOUNTER — Encounter: Payer: Medicaid Other | Admitting: Obstetrics and Gynecology

## 2018-06-03 LAB — CBC
HEMATOCRIT: 28.7 % — AB (ref 36.0–46.0)
HEMOGLOBIN: 9.3 g/dL — AB (ref 12.0–15.0)
MCH: 28.3 pg (ref 26.0–34.0)
MCHC: 32.4 g/dL (ref 30.0–36.0)
MCV: 87.2 fL (ref 80.0–100.0)
Platelets: 207 10*3/uL (ref 150–400)
RBC: 3.29 MIL/uL — ABNORMAL LOW (ref 3.87–5.11)
RDW: 16.2 % — AB (ref 11.5–15.5)
WBC: 16.4 10*3/uL — AB (ref 4.0–10.5)
nRBC: 0 % (ref 0.0–0.2)

## 2018-06-03 LAB — RPR: RPR Ser Ql: NONREACTIVE

## 2018-06-03 NOTE — Lactation Note (Addendum)
This note was copied from a baby's chart. Lactation Consultation Note  Patient Name: Alyssa Duffy ZOXWR'[id number] Date: 06/03/2018 Reason for consult: Initial assessment;Primapara   Maternal Data Formula Feeding for Exclusion: No Does the patient have breastfeeding experience prior to this delivery?: No Mom has many family members who breastfed, has good support Feeding Feeding Type: Breast Fed  LATCH Score Latch: Grasps breast easily, tongue down, lips flanged, rhythmical sucking.  Audible Swallowing: Spontaneous and intermittent  Type of Nipple: Everted at rest and after stimulation  Comfort (Breast/Nipple): Soft / non-tender  Hold (Positioning): Assistance needed to correctly position infant at breast and maintain latch.  LATCH Score: 9  Interventions Interventions: Breast feeding basics reviewed;Assisted with latch;Skin to skin;Hand express;Breast compression;Adjust position;Support pillows  Lactation Tools Discussed/Used     Consult Status Consult Status: PRN    Dyann Kief 06/03/2018, 2:03 PM

## 2018-06-03 NOTE — Progress Notes (Signed)
Post Partum Day 1/ gestational hypertension Subjective: up ad lib, voiding, tolerating PO and baby breast feeding well.   Objective: Blood pressure 105/62, pulse 93, temperature 98.3 F (36.8 C), temperature source Oral, resp. rate 18, height _0  (1.499 m), weight 76.2 kg, last menstrual period 08/03/2017, SpO2 99 %, unknown if currently breastfeeding.  Physical Exam:  General: alert, cooperative and no distress Lochia: appropriate Uterine Fundus: firm/at U/ ML/ NT Perineum: some labial swelling, no bruising DVT Evaluation: No evidence of DVT seen on physical exam.  Recent Labs    06/02/18 1256 06/03/18 0505  HGB 10.7* 9.3*  HCT 33.5* 28.7*  WBC 12.1* 16.4*  PLT 208 207    Assessment/Plan: Stable PPD #1 with normal blood pressures postpartum  Continue postpartum care  Support breast feeding  Probable discharge tomorrow Chronic iron deficiency anemia-worsened with acute blood loss  Vitamins with iron Probable discharge tomorrow  O POS/ RNI /VNI-offer Varivax and MMR prior to discharge  Received flu vaccine AP  Last TDAP?-did not receive AP  Breast/ Contraception ?  Dalia Heading, CNM        LOS: 1 day   Dalia Heading 06/03/2018, 9:52 AM

## 2018-06-04 MED ORDER — NORETHINDRONE 0.35 MG PO TABS: 1.0000 | ORAL_TABLET | Freq: Every day | ORAL | 4 refills | Status: DC

## 2018-06-04 MED ORDER — TETANUS-DIPHTH-ACELL PERTUSSIS 5-2.5-18.5 LF-MCG/0.5 IM SUSP
0.5000 mL | Freq: Once | INTRAMUSCULAR | Status: AC
  Administered 2018-06-04: 0.5 mL via INTRAMUSCULAR
  Filled 2018-06-04: qty 0.5

## 2018-06-04 MED ORDER — HYDROXYZINE HCL 25 MG PO TABS: 25.0000 mg | ORAL_TABLET | Freq: Four times a day (QID) | ORAL | 2 refills | Status: DC | PRN

## 2018-06-04 MED ORDER — TETANUS-DIPHTH-ACELL PERTUSSIS 5-2.5-18.5 LF-MCG/0.5 IM SUSP: 0.5000 mL | Freq: Once | INTRAMUSCULAR | 0 refills | Status: DC

## 2018-06-04 NOTE — Lactation Note (Signed)
This note was copied from a baby's chart. Lactation Consultation Note  Patient Name: Girl Nyliah Nierenberg ZOXWR'[id number] Date: 06/04/2018 Reason for consult: Initial assessment   Maternal Data    Feeding Feeding Type: Breast Fed  LATCH Score Latch: Repeated attempts needed to sustain latch, nipple held in mouth throughout feeding, stimulation needed to elicit sucking reflex.  Audible Swallowing: Spontaneous and intermittent  Type of Nipple: Everted at rest and after stimulation  Comfort (Breast/Nipple): Soft / non-tender  Hold (Positioning): Assistance needed to correctly position infant at breast and maintain latch.  LATCH Score: 8  Interventions Interventions: Assisted with latch;Adjust position;Support pillows  Lactation Tools Discussed/Used     Consult Status Consult Status: Follow-up Date: 06/04/18 Follow-up type: In-patient  Infant able to successfully latch to the left breast but needed breast compressions for the constant flow of colostrum. She seemed frustrated when the flow slowed down but satisfied again when breast compressions were performed. Mother is anxious about breastfeeding and has questions about clusterfeeding and if infant is getting enough. LC reviewed the process of clusterfeeding and frequency of wet and dirty diapers along with hearing swallows as indicators of how much infant is getting. FOB is also active in breastfeeding and assisting with latching and breast compressions.  Arlyss Gandy 06/04/2018, 2:35 PM

## 2018-07-12 ENCOUNTER — Encounter: Payer: Self-pay | Admitting: Obstetrics & Gynecology

## 2018-07-12 ENCOUNTER — Ambulatory Visit (INDEPENDENT_AMBULATORY_CARE_PROVIDER_SITE_OTHER): Payer: Medicaid Other | Admitting: Obstetrics & Gynecology

## 2018-07-12 MED ORDER — NORETHIN-ETH ESTRAD-FE BIPHAS 1 MG-10 MCG / 10 MCG PO TABS: 1.0000 | ORAL_TABLET | Freq: Every day | ORAL | 3 refills | Status: DC

## 2018-07-12 NOTE — Progress Notes (Signed)
  OBSTETRICS POSTPARTUM CLINIC PROGRESS NOTE  Subjective:     Alyssa Duffy is a 22 y.o. 211P1001 female who presents for a postpartum visit. She is 6 weeks postpartum following a Term pregnancy and delivery by Vaginal, no problems at delivery.  I have fully reviewed the prenatal and intrapartum course. Anesthesia: epidural.  Postpartum course has been complicated by uncomplicated.  Baby is feeding by Breast.  Bleeding: patient has not  resumed menses.  Bowel function is normal. Bladder function is normal.  Patient is not sexually active. Contraception method desired is oral progesterone-only contraceptive.  Postpartum depression screening: negative. [place] 9.  The following portions of the patient's history were reviewed and updated as appropriate: allergies, current medications, past family history, past medical history, past social history, past surgical history and problem list.  Review of Systems Pertinent items noted in HPI and remainder of comprehensive ROS otherwise negative.  Objective:    BP 140/80   Ht 4\' 11"  (1.499 m)   Wt 142 lb (64.4 kg)   LMP 07/08/2018   BMI 28.68 kg/m   General:  alert and no distress   Breasts:  inspection negative, no nipple discharge or bleeding, no masses or nodularity palpable  Lungs: clear to auscultation bilaterally  Heart:  regular rate and rhythm, S1, S2 normal, no murmur, click, rub or gallop  Abdomen: soft, non-tender; bowel sounds normal; no masses,  no organomegaly.     Vulva:  normal  Vagina: normal vagina, no discharge, exudate, lesion, or erythema  Cervix:  no cervical motion tenderness and no lesions  Corpus: normal size, contour, position, consistency, mobility, non-tender  Adnexa:  normal adnexa and no mass, fullness, tenderness  Rectal Exam: Not performed.        Assessment:  Post Partum Care visit 1. Postpartum care following vaginal delivery  Plan:  See orders and Patient Instructions Contraceptive counseling for  oral contraceptives (estrogen/progesterone) Resume all normal activities Follow up in: 3 months  or as needed.   Annamarie MajorPaul Hisao Doo, MD, Merlinda FrederickFACOG [facility], [facility] 07/12/2018  10:24 AM

## 2018-10-09 ENCOUNTER — Encounter: Payer: Self-pay | Admitting: Obstetrics & Gynecology

## 2018-10-10 ENCOUNTER — Other Ambulatory Visit: Payer: Self-pay

## 2018-10-10 MED ORDER — NORETHIN-ETH ESTRAD-FE BIPHAS 1 MG-10 MCG / 10 MCG PO TABS: 1.0000 | ORAL_TABLET | Freq: Every day | ORAL | 3 refills | Status: DC

## 2018-10-10 NOTE — Telephone Encounter (Signed)
Does pt need to make annual appt now or wait until the coronavirus is not a issue?

## 2018-11-05 ENCOUNTER — Other Ambulatory Visit: Payer: Self-pay | Admitting: Advanced Practice Midwife

## 2018-11-07 NOTE — Telephone Encounter (Signed)
Please advise 

## 2019-02-20 ENCOUNTER — Encounter: Payer: Self-pay | Admitting: Emergency Medicine

## 2019-02-20 ENCOUNTER — Other Ambulatory Visit: Payer: Self-pay

## 2019-02-20 ENCOUNTER — Ambulatory Visit
Admission: EM | Admit: 2019-02-20 | Discharge: 2019-02-20 | Disposition: A | Discharge status: Home / Self Care | Payer: Medicaid Other | Attending: Family Medicine | Admitting: Family Medicine

## 2019-02-20 DIAGNOSIS — N3001 Acute cystitis with hematuria: Secondary | ICD-10-CM | POA: Diagnosis present

## 2019-02-20 LAB — URINALYSIS, COMPLETE (UACMP) WITH MICROSCOPIC

## 2019-02-20 MED ORDER — CEPHALEXIN 500 MG PO CAPS: 500.0000 mg | ORAL_CAPSULE | Freq: Two times a day (BID) | ORAL | 0 refills | Status: DC

## 2019-02-20 NOTE — ED Triage Notes (Signed)
Patient c/o urinary frequency, dysuria and hematuria that started last Thursday. She states she started taking AZO on Friday.

## 2019-02-20 NOTE — ED Provider Notes (Signed)
[facility]    CSN: [id number] Arrival date & time: 02/20/19  1406  History   Chief Complaint Chief Complaint  Patient presents with  . Dysuria  . Urinary Frequency   HPI  23 year old female presents with concerns for UTI.  Patient states that her symptoms started on Thursday.  She reports urinary frequency, urgency, and dysuria.  She reports possible hematuria as she had "blood-tinged urine".  She has taken Azo with improvement in dysuria.  However, her urinary frequency and urgency remain.  No abdominal pain.  No fever.  No known exacerbating factors.  No other associated symptoms.  PMH, Surgical Hx, Family Hx, Social History reviewed and updated as below.  PMH: Hx of high risk pregnancy, Hx of Obesity  Surgical Hx: None.  OB History    Gravida  1   Para  1   Term  1   Preterm      AB      Living  1     SAB      TAB      Ectopic      Multiple  0   Live Births  1          Home Medications    Prior to Admission medications   Medication Sig Start Date End Date Taking? Authorizing Provider  Norethindrone-Ethinyl Estradiol-Fe Biphas (LO LOESTRIN FE) 1 MG-10 MCG / 10 MCG tablet Take 1 tablet by mouth daily. 10/10/18 02/20/19 Yes Nadara MustardHarris, Robert P, MD  cephALEXin (KEFLEX) 500 MG capsule Take 1 capsule (500 mg total) by mouth 2 (two) times daily. 02/20/19   Tommie Samsook, Johnasia Liese G, DO   Family History Family History  Problem Relation Age of Onset  . Diabetes Mellitus II Maternal Aunt    Social History Social History   Tobacco Use  . Smoking status: Never Smoker  . Smokeless tobacco: Never Used  Substance Use Topics  . Alcohol use: No    Frequency: Never  . Drug use: No   Allergies   Pecan pollen  Review of Systems Review of Systems  Constitutional: Negative.   Gastrointestinal: Negative.   Genitourinary: Positive for dysuria, frequency and urgency.   Physical Exam Triage Vital Signs ED Triage Vitals [02/20/19 1426]  Enc Vitals Group   BP 135/76     Pulse Rate (!) 107     Resp 18     Temp 98.9 F (37.2 C)     Temp Source Oral     SpO2 98 %     Weight      Height 4\' 11"  (1.499 m)     Head Circumference      Peak Flow      Pain Score 0     Pain Loc      Pain Edu?      Excl. in GC?    Updated Vital Signs BP 135/76 (BP Location: Right Arm)   Pulse (!) 107   Temp 98.9 F (37.2 C) (Oral)   Resp 18   Ht 4\' 11"  (1.499 m)   LMP 01/19/2019   SpO2 98%   BMI 28.68 kg/m   Visual Acuity Right Eye Distance:   Left Eye Distance:   Bilateral Distance:    Right Eye Near:   Left Eye Near:    Bilateral Near:     Physical Exam Vitals signs and nursing note reviewed.  Constitutional:      General: She is not in acute distress.    Appearance: Normal appearance.  HENT:     Head: Normocephalic and atraumatic.  Eyes:     General:        Right eye: No discharge.        Left eye: No discharge.     Conjunctiva/sclera: Conjunctivae normal.  Cardiovascular:     Rate and Rhythm: Normal rate and regular rhythm.  Pulmonary:     Effort: Pulmonary effort is normal.     Breath sounds: Normal breath sounds.  Abdominal:     General: There is no distension.     Palpations: Abdomen is soft.     Tenderness: There is no abdominal tenderness.  Neurological:     Mental Status: She is alert.  Psychiatric:        Mood and Affect: Mood normal.        Behavior: Behavior normal.    UC Treatments / Results  Labs (all labs ordered are listed, but only abnormal results are displayed) Labs Reviewed  URINALYSIS, COMPLETE (UACMP) WITH MICROSCOPIC - Abnormal; Notable for the following components:      Result Value   Color, Urine ORANGE (*)    Glucose, UA   (*)    Value: TEST NOT REPORTED DUE TO COLOR INTERFERENCE OF URINE PIGMENT   Hgb urine dipstick   (*)    Value: TEST NOT REPORTED DUE TO COLOR INTERFERENCE OF URINE PIGMENT   Bilirubin Urine   (*)    Value: TEST NOT REPORTED DUE TO COLOR INTERFERENCE OF URINE PIGMENT    Ketones, ur   (*)    Value: TEST NOT REPORTED DUE TO COLOR INTERFERENCE OF URINE PIGMENT   Protein, ur   (*)    Value: TEST NOT REPORTED DUE TO COLOR INTERFERENCE OF URINE PIGMENT   Nitrite   (*)    Value: TEST NOT REPORTED DUE TO COLOR INTERFERENCE OF URINE PIGMENT   Leukocytes,Ua   (*)    Value: TEST NOT REPORTED DUE TO COLOR INTERFERENCE OF URINE PIGMENT   Bacteria, UA MANY (*)    All other components within normal limits  URINE CULTURE    EKG   Radiology No results found.  Procedures Procedures (including critical care time)  Medications Ordered in UC Medications - No data to display  Initial Impression / Assessment and Plan / UC Course  I have reviewed the triage vital signs and the nursing notes.  Pertinent labs & imaging results that were available during my care of the patient were reviewed by me and considered in my medical decision making (see chart for details).    23 year old female presents with UTI.  Sending culture.  Starting on Keflex.  Final Clinical Impressions(s) / UC Diagnoses   Final diagnoses:  Acute cystitis with hematuria   Discharge Instructions   None    ED Prescriptions    Medication Sig Dispense Auth. Provider   cephALEXin (KEFLEX) 500 MG capsule Take 1 capsule (500 mg total) by mouth 2 (two) times daily. 14 capsule Coral Spikes, DO     Controlled Substance Prescriptions [place] Controlled Substance Registry consulted? Not Applicable   Coral Spikes, DO 02/20/19 1514

## 2019-02-22 LAB — URINE CULTURE: Culture: >=100000 — AB

## 2019-02-23 ENCOUNTER — Telehealth (HOSPITAL_COMMUNITY): Payer: Self-pay | Admitting: Emergency Medicine

## 2019-02-23 NOTE — Telephone Encounter (Signed)
Urine culture was positive for e coli and was given keflex  at urgent care visit. Attempted to reach patient. No answer at this time.   

## 2019-09-11 ENCOUNTER — Other Ambulatory Visit (HOSPITAL_COMMUNITY)
Admission: RE | Admit: 2019-09-11 | Discharge: 2019-09-11 | Disposition: A | Discharge status: Home / Self Care | Payer: Medicaid Other | Source: Ambulatory Visit | Attending: Obstetrics & Gynecology | Admitting: Obstetrics & Gynecology

## 2019-09-11 ENCOUNTER — Other Ambulatory Visit: Payer: Self-pay

## 2019-09-11 ENCOUNTER — Ambulatory Visit: Payer: Medicaid Other | Admitting: Obstetrics & Gynecology

## 2019-09-11 ENCOUNTER — Encounter: Payer: Self-pay | Admitting: Obstetrics & Gynecology

## 2019-09-11 ENCOUNTER — Ambulatory Visit (INDEPENDENT_AMBULATORY_CARE_PROVIDER_SITE_OTHER): Payer: PRIVATE HEALTH INSURANCE | Admitting: Obstetrics & Gynecology

## 2019-09-11 VITALS — BP 128/82 | Ht 59.0 in | Wt 139.0 lb

## 2019-09-11 DIAGNOSIS — Z124 Encounter for screening for malignant neoplasm of cervix: Secondary | ICD-10-CM | POA: Insufficient documentation

## 2019-09-11 DIAGNOSIS — Z23 Encounter for immunization: Secondary | ICD-10-CM

## 2019-09-11 DIAGNOSIS — Z01419 Encounter for gynecological examination (general) (routine) without abnormal findings: Secondary | ICD-10-CM | POA: Diagnosis not present

## 2019-09-11 DIAGNOSIS — Z113 Encounter for screening for infections with a predominantly sexual mode of transmission: Secondary | ICD-10-CM

## 2019-09-11 MED ORDER — ESCITALOPRAM OXALATE 10 MG PO TABS: 10.0000 mg | ORAL_TABLET | Freq: Every day | ORAL | 3 refills | Status: DC

## 2019-09-11 MED ORDER — LO LOESTRIN FE 1 MG-10 MCG / 10 MCG PO TABS: 1.0000 | ORAL_TABLET | Freq: Every day | ORAL | 3 refills | Status: DC

## 2019-09-11 MED ORDER — ESCITALOPRAM OXALATE 10 MG PO TABS: 10.0000 mg | ORAL_TABLET | Freq: Every day | ORAL | 11 refills | Status: DC

## 2019-09-11 NOTE — Progress Notes (Signed)
HPI:      [name] is a 24 y.o. G1P1001 who LMP was Patient's last menstrual period was 09/02/2019., she presents today for her annual examination. The patient has no complaints today, other than more stress this year w FOB/work; has not had reg periods starting in Nov (missing some, LMP 2 days in Feb), and anxiety which she had postpartum but did not respond well to therapy then . The patient is sexually active. Her last pap: approximate date 2019 and was normal. The patient does perform self breast exams.  There is no notable family history of breast or ovarian cancer in her family.  The patient has regular exercise: yes.  The patient denies current symptoms of depression but does have anxiety, heart racing, stress.    GYN History: Contraception: OCP (estrogen/progesterone)  PMHx: Past Medical History:  Diagnosis Date  . Medical history non-contributory    Past Surgical History:  Procedure Laterality Date  . DENTAL SURGERY     Family History  Problem Relation Age of Onset  . Diabetes Mellitus II Maternal Aunt    Social History   Tobacco Use  . Smoking status: Never Smoker  . Smokeless tobacco: Never Used  Substance Use Topics  . Alcohol use: No  . Drug use: No    Current Outpatient Medications:  .  escitalopram (LEXAPRO) 10 MG tablet, Take 1 tablet (10 mg total) by mouth daily., Disp: 30 tablet, Rfl: 11 .  Norethindrone-Ethinyl Estradiol-Fe Biphas (LO LOESTRIN FE) 1 MG-10 MCG / 10 MCG tablet, Take 1 tablet by mouth daily., Disp: 84 tablet, Rfl: 3 Allergies: Pecan pollen  Review of Systems  Constitutional: Positive for malaise/fatigue. Negative for chills and fever.  HENT: Positive for congestion. Negative for sinus pain and sore throat.   Eyes: Negative for blurred vision and pain.  Respiratory: Negative for cough and wheezing.   Cardiovascular: Negative for chest pain and leg swelling.  Gastrointestinal: Positive for nausea. Negative for abdominal pain,  constipation, diarrhea, heartburn and vomiting.  Genitourinary: Negative for dysuria, frequency, hematuria and urgency.  Musculoskeletal: Negative for back pain, joint pain, myalgias and neck pain.  Skin: Negative for itching and rash.  Neurological: Positive for dizziness and headaches. Negative for tremors and weakness.  Endo/Heme/Allergies: Does not bruise/bleed easily.  Psychiatric/Behavioral: Negative for depression. The patient is nervous/anxious. The patient does not have insomnia.     Objective: BP 128/82   Ht 4\' 11"  (1.499 m)   Wt 139 lb (63 kg)   LMP 09/02/2019   BMI 28.07 kg/m   Filed Weights   09/11/19 1127  Weight: 139 lb (63 kg)   Body mass index is 28.07 kg/m. Physical Exam Constitutional:      General: She is not in acute distress.    Appearance: She is well-developed.  Genitourinary:     Pelvic exam was performed with patient supine.     Vagina, uterus and rectum normal.     No lesions in the vagina.     No vaginal bleeding.     No cervical motion tenderness, friability, lesion or polyp.     Uterus is mobile.     Uterus is not enlarged.     No uterine mass detected.    Uterus is midaxial.     No right or left adnexal mass present.     Right adnexa not tender.     Left adnexa not tender.  HENT:     Head: Normocephalic and atraumatic. No laceration.  Right Ear: Hearing normal.     Left Ear: Hearing normal.     Mouth/Throat:     Pharynx: Uvula midline.  Eyes:     Pupils: Pupils are equal, round, and reactive to light.  Neck:     Thyroid: No thyromegaly.  Cardiovascular:     Rate and Rhythm: Normal rate and regular rhythm.     Heart sounds: No murmur. No friction rub. No gallop.   Pulmonary:     Effort: Pulmonary effort is normal. No respiratory distress.     Breath sounds: Normal breath sounds. No wheezing.  Chest:     Breasts:        Right: No mass, skin change or tenderness.        Left: No mass, skin change or tenderness.  Abdominal:      General: Bowel sounds are normal. There is no distension.     Palpations: Abdomen is soft.     Tenderness: There is no abdominal tenderness. There is no rebound.  Musculoskeletal:        General: Normal range of motion.     Cervical back: Normal range of motion and neck supple.  Neurological:     Mental Status: She is alert and oriented to person, place, and time.     Cranial Nerves: No cranial nerve deficit.  Skin:    General: Skin is warm and dry.  Psychiatric:        Judgment: Judgment normal.  Vitals reviewed.     Assessment:  ANNUAL EXAM 1. Need for immunization against influenza   2. Screening for cervical cancer   3. Women's annual routine gynecological examination   4. Screen for STD (sexually transmitted disease)      Screening Plan:            1.  Cervical Screening-  Pap smear done today  2. Breast screening- Exam annually and mammogram>40 planned   3. Labs for STD check today per pt request  4. Counseling for contraception: oral contraceptives (estrogen/progesterone)  Alternatives discussed, desires to cont w same OCP Monitor cycles, may be disrupted due to stress, change to third shift, marital stress, hormonal effect [employer] for Rx  5. Flu Vaccine QUAD 36+ mos IM  6. Anxiety, stress.  Lexparo discussed, pros and cons and side effects; follow up 2 mos discussion    F/U  Return in about 2 months (around 11/09/2019) for Follow up Virtual Visit.  Barnett Applebaum, MD, [facility], [facility] 09/11/2019  12:07 PM

## 2019-09-11 NOTE — Patient Instructions (Signed)
Escitalopram tablets What is this medicine? ESCITALOPRAM (es sye TAL oh pram) is used to treat depression and certain types of anxiety. This medicine may be used for other purposes; ask your health care provider or pharmacist if you have questions. COMMON BRAND NAME(S): Lexapro What should I tell my health care provider before I take this medicine? They need to know if you have any of these conditions:  bipolar disorder or a family history of bipolar disorder  diabetes  glaucoma  heart disease  kidney or liver disease  receiving electroconvulsive therapy  seizures (convulsions)  suicidal thoughts, plans, or attempt by you or a family member  an unusual or allergic reaction to escitalopram, the related drug citalopram, other medicines, foods, dyes, or preservatives  pregnant or trying to become pregnant  breast-feeding How should I use this medicine? Take this medicine by mouth with a glass of water. Follow the directions on the prescription label. You can take it with or without food. If it upsets your stomach, take it with food. Take your medicine at regular intervals. Do not take it more often than directed. Do not stop taking this medicine suddenly except upon the advice of your doctor. Stopping this medicine too quickly may cause serious side effects or your condition may worsen. A special MedGuide will be given to you by the pharmacist with each prescription and refill. Be sure to read this information carefully each time. Talk to your pediatrician regarding the use of this medicine in children. Special care may be needed. Overdosage: If you think you have taken too much of this medicine contact a poison control center or emergency room at once. NOTE: This medicine is only for you. Do not share this medicine with others. What if I miss a dose? If you miss a dose, take it as soon as you can. If it is almost time for your next dose, take only that dose. Do not take double or  extra doses. What may interact with this medicine? Do not take this medicine with any of the following medications:  certain medicines for fungal infections like fluconazole, itraconazole, ketoconazole, posaconazole, voriconazole  cisapride  citalopram  dronedarone  linezolid  MAOIs like Carbex, Eldepryl, Marplan, Nardil, and Parnate  methylene blue (injected into a vein)  pimozide  thioridazine This medicine may also interact with the following medications:  alcohol  amphetamines  aspirin and aspirin-like medicines  carbamazepine  certain medicines for depression, anxiety, or psychotic disturbances  certain medicines for migraine headache like almotriptan, eletriptan, frovatriptan, naratriptan, rizatriptan, sumatriptan, zolmitriptan  certain medicines for sleep  certain medicines that treat or prevent blood clots like warfarin, enoxaparin, dalteparin  cimetidine  diuretics  dofetilide  fentanyl  furazolidone  isoniazid  lithium  metoprolol  NSAIDs, medicines for pain and inflammation, like ibuprofen or naproxen  other medicines that prolong the QT interval (cause an abnormal heart rhythm)  procarbazine  rasagiline  supplements like St. John's wort, kava kava, valerian  tramadol  tryptophan  ziprasidone This list may not describe all possible interactions. Give your health care provider a list of all the medicines, herbs, non-prescription drugs, or dietary supplements you use. Also tell them if you smoke, drink alcohol, or use illegal drugs. Some items may interact with your medicine. What should I watch for while using this medicine? Tell your doctor if your symptoms do not get better or if they get worse. Visit your doctor or health care professional for regular checks on your progress. Because it may   take several weeks to see the full effects of this medicine, it is important to continue your treatment as prescribed by your doctor. Patients  and their families should watch out for new or worsening thoughts of suicide or depression. Also watch out for sudden changes in feelings such as feeling anxious, agitated, panicky, irritable, hostile, aggressive, impulsive, severely restless, overly excited and hyperactive, or not being able to sleep. If this happens, especially at the beginning of treatment or after a change in dose, call your health care professional. You may get drowsy or dizzy. Do not drive, use machinery, or do anything that needs mental alertness until you know how this medicine affects you. Do not stand or sit up quickly, especially if you are an older patient. This reduces the risk of dizzy or fainting spells. Alcohol may interfere with the effect of this medicine. Avoid alcoholic drinks. Your mouth may get dry. Chewing sugarless gum or sucking hard candy, and drinking plenty of water may help. Contact your doctor if the problem does not go away or is severe. What side effects may I notice from receiving this medicine? Side effects that you should report to your doctor or health care professional as soon as possible:  allergic reactions like skin rash, itching or hives, swelling of the face, lips, or tongue  anxious  black, tarry stools  changes in vision  confusion  elevated mood, decreased need for sleep, racing thoughts, impulsive behavior  eye pain  fast, irregular heartbeat  feeling faint or lightheaded, falls  feeling agitated, angry, or irritable  hallucination, loss of contact with reality  loss of balance or coordination  loss of memory  painful or prolonged erections  restlessness, pacing, inability to keep still  seizures  stiff muscles  suicidal thoughts or other mood changes  trouble sleeping  unusual bleeding or bruising  unusually weak or tired  vomiting Side effects that usually do not require medical attention (report to your doctor or health care professional if they  continue or are bothersome):  changes in appetite  change in sex drive or performance  headache  increased sweating  indigestion, nausea  tremors This list may not describe all possible side effects. Call your doctor for medical advice about side effects. You may report side effects to FDA at 1-800-FDA-1088. Where should I keep my medicine? Keep out of reach of children. Store at room temperature between 15 and 30 degrees C (59 and 86 degrees F). Throw away any unused medicine after the expiration date. NOTE: This sheet is a summary. It may not cover all possible information. If you have questions about this medicine, talk to your doctor, pharmacist, or health care provider.  2020 Elsevier/Gold Standard (2018-07-04 11:21:44)  

## 2019-09-12 LAB — HEP, RPR, HIV PANEL
HIV Screen 4th Generation wRfx: NONREACTIVE
Hepatitis B Surface Ag: NEGATIVE
RPR Ser Ql: NONREACTIVE

## 2019-09-14 ENCOUNTER — Other Ambulatory Visit: Payer: Self-pay | Admitting: Obstetrics & Gynecology

## 2019-09-14 ENCOUNTER — Other Ambulatory Visit: Payer: Self-pay

## 2019-09-14 LAB — CYTOLOGY - PAP
Chlamydia: NEGATIVE
Comment: NEGATIVE
Comment: NORMAL
Diagnosis: NEGATIVE
Neisseria Gonorrhea: NEGATIVE

## 2019-09-14 MED ORDER — ESCITALOPRAM OXALATE 10 MG PO TABS: 10.0000 mg | ORAL_TABLET | Freq: Every day | ORAL | 3 refills | Status: DC

## 2019-09-14 NOTE — Telephone Encounter (Signed)
I have sent Rx to [facility]

## 2019-09-18 ENCOUNTER — Other Ambulatory Visit: Payer: Self-pay | Admitting: Obstetrics & Gynecology

## 2019-09-29 ENCOUNTER — Other Ambulatory Visit: Payer: Self-pay | Admitting: Obstetrics & Gynecology

## 2019-10-30 ENCOUNTER — Telehealth: Payer: Self-pay

## 2019-10-30 NOTE — Telephone Encounter (Signed)
Called pt to inform her RPH is not in office today but I had sent her message to 2 different Drs that are here in office one on call. Pt sent a message needing RPH to change her  Anxiety medicine as its giving her suicidal thoughts , I called pt and she didn't answer and her voice mail box is full.

## 2019-10-30 NOTE — Telephone Encounter (Signed)
When is Alyssa Duffy back in office? She can be scheduled for a virtual visit with him on that day so they can decide about medicine. I will send her a message. If she writes back then we can schedule.

## 2019-10-30 NOTE — Telephone Encounter (Signed)
Please call patient back. If she is having suicidal thoughts she needs to be seen in the ER today.

## 2019-10-30 NOTE — Telephone Encounter (Signed)
Not sure what's the next step is since pt will not answer.

## 2019-10-30 NOTE — Telephone Encounter (Signed)
Please advise since [facility] is not in office and pt is having Suicidal thoughts

## 2019-10-30 NOTE — Telephone Encounter (Signed)
I called pt before I sent you the message and she did not answer and voice mail box is full. I just called her back after receiving your message and same no answer and voice mail box full.

## 2019-10-31 ENCOUNTER — Other Ambulatory Visit: Payer: Self-pay | Admitting: Obstetrics & Gynecology

## 2019-10-31 DIAGNOSIS — F419 Anxiety disorder, unspecified: Secondary | ICD-10-CM

## 2019-10-31 NOTE — Telephone Encounter (Signed)
Thx for responding to her request in my abscence.  We will follow up with her as well as arrange for psychiatry consultation to further evaluate and manage.

## 2019-10-31 NOTE — Telephone Encounter (Signed)
Your patient messaged with suicidal ideation. She would like you to adjust her medications.

## 2019-10-31 NOTE — Telephone Encounter (Signed)
Please advise 

## 2019-11-02 NOTE — Telephone Encounter (Signed)
Sch Tele Appt to discuss change in medicine

## 2019-11-02 NOTE — Telephone Encounter (Signed)
Voicemail box is full unable to leave message for patient to call back to be schedule 

## 2020-05-10 IMAGING — US US MFM OB DETAIL+14 WK
1 series · 12 of 28 positions shown · non-contrast
Comparison: none

PATIENT INFO:

Name:       JHONATHAM RIPARDO                 Visit Date: 04/14/2018 [DATE]
PERFORMED BY:
Sonographer
SERVICE(S) PROVIDED:
INDICATIONS:
32 weeks gestation of pregnancy
Irregular fetal heart rate
Anatomy/growth
Obesity (BMI 32)
FETAL EVALUATION:
Num Of Fetuses:         1
Fetal Heart Rate(bpm):  157
Cardiac Activity:       Present
Presentation:           Cephalic
Placenta:               Anterior, No previa
P. Cord Insertion:      Normal
Amniotic Fluid
AFI FV:      Normal
AFI Sum(cm)     %Tile
16.5            60
BIOMETRY:
BPD:      82.3  mm     G. Age:  33w 1d         50  %    CI:        74.56   %    70 - 86
FL/HC:      21.1   %    19.9 -
HC:      302.5  mm     G. Age:  33w 4d         32  %    HC/AC:      0.94        0.96 -
AC:      320.7  mm     G. Age:  36w 0d       > 97  %    FL/BPD:     77.6   %    71 - 87
FL:       63.9  mm     G. Age:  33w 0d         42  %    FL/AC:      19.9   %    20 - 24
HUM:      56.6  mm     G. Age:  32w 6d         58  %
Est. FW:    4504  gm      5 lb 8 oz     89  %
GESTATIONAL AGE:
LMP:           36w 2d        Date:  08/03/17                 EDD:   05/10/18
U/S Today:     34w 0d                                        EDD:   05/26/18
Best:          32w 6d     Det. By:  Early Ultrasound         EDD:   06/03/18
(10/15/17)
ANATOMY:
Cavum:                 CSP visualized         Ductal Arch:            Normal appearance
Ventricles:            Normal appearance      Diaphragm:              Within Normal Limits
Choroid Plexus:        Within Normal Limits   Stomach:                Seen
Cerebellum:            Within Normal Limits   Abdomen:                Within Normal
Limits
Posterior Fossa:       Within Normal Limits   Abdominal Wall:         Suboptimal
Face:                  Orbits visualized      Cord Vessels:           3 vessels
Lips:                  Normal appearance      Kidneys:                Normal appearance
Heart:                 4-Chamber view         Bladder:                Seen
appears normal
RVOT:                  Normal appearance      Spine:                  Suboptimal views
LVOT:                  Normal appearance      Upper Extremities:      Visualized
Aortic Arch:           Normal appearance      Lower Extremities:      Visualized
Other:  Ao/Pa and profile appear normal; 3VT suboptimally visualized
CERVIX UTERUS ADNEXA:
Right Ovary
Size(cm)       3.2  x   1.6    x  2.2       Vol(ml):

[Series 1: us mfm ob detail+14 wk · 0.26mm/px · 12 of 92 slices shown]
[im 4/92]
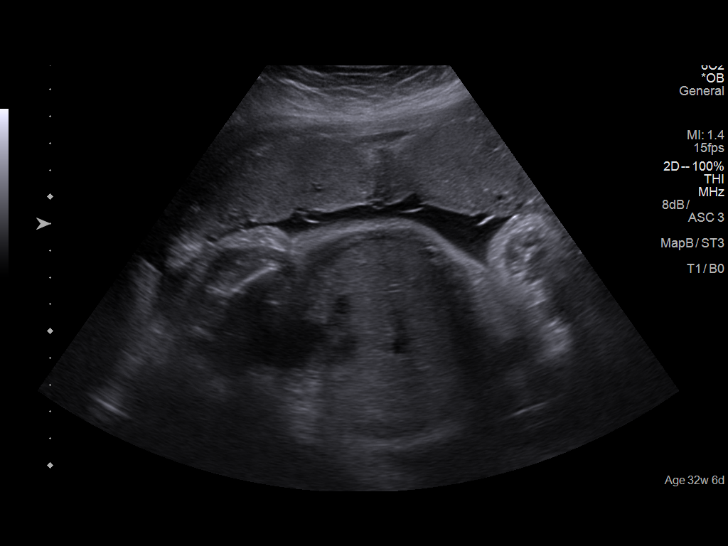
[im 11/92]
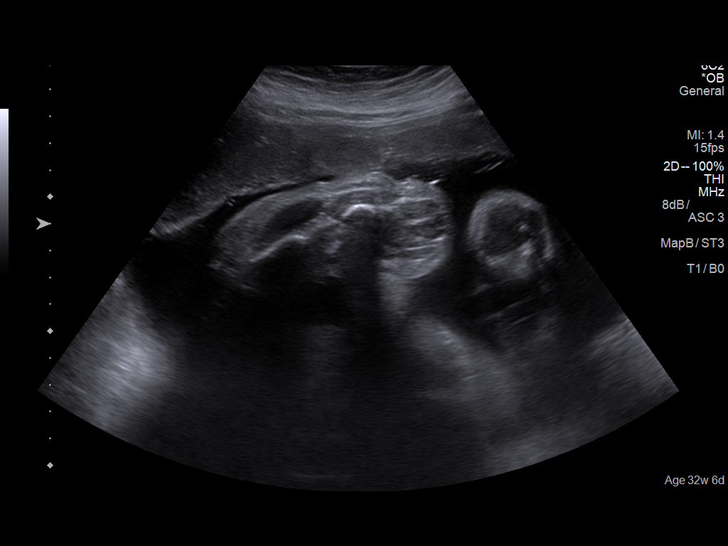
[im 17/92]
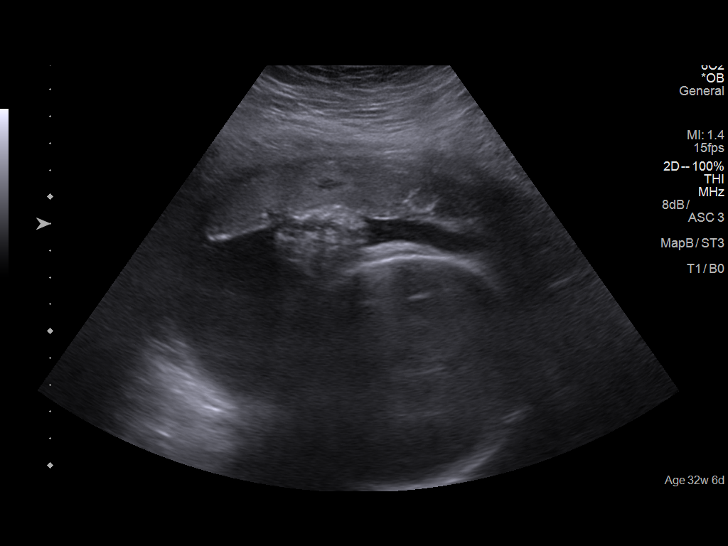
[im 27/92]
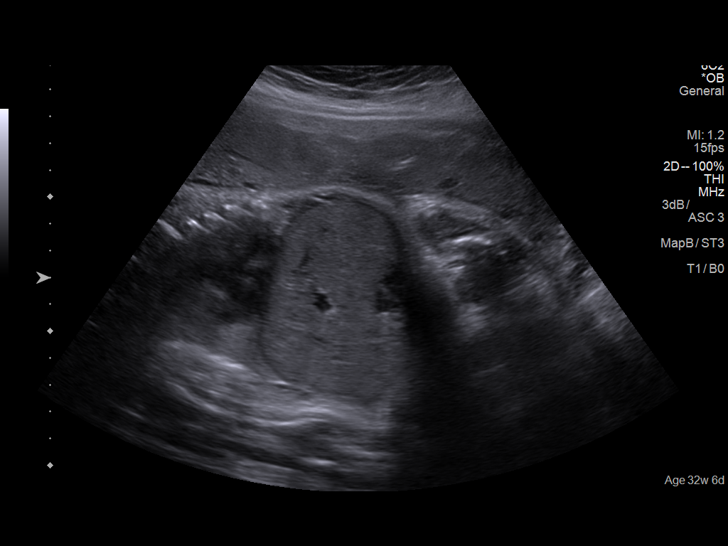
[im 34/92]
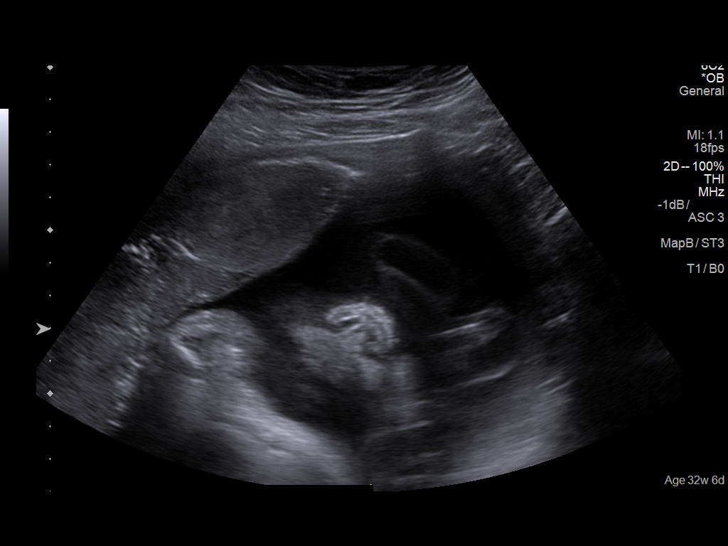
[im 41/92]
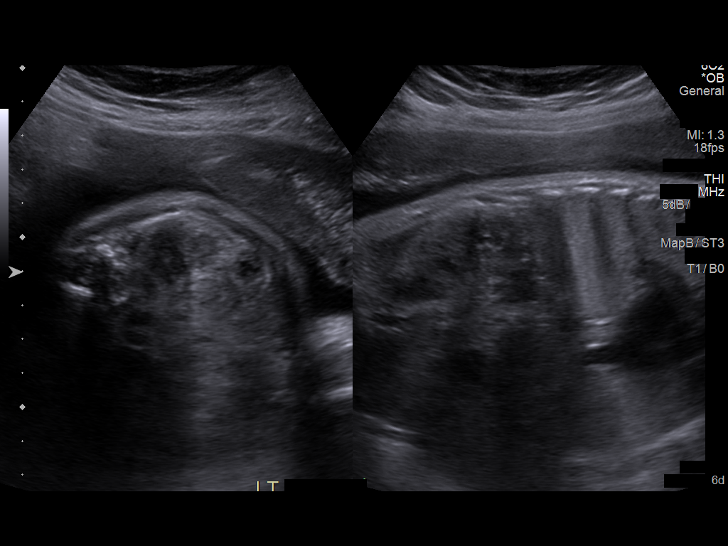
[im 51/92]
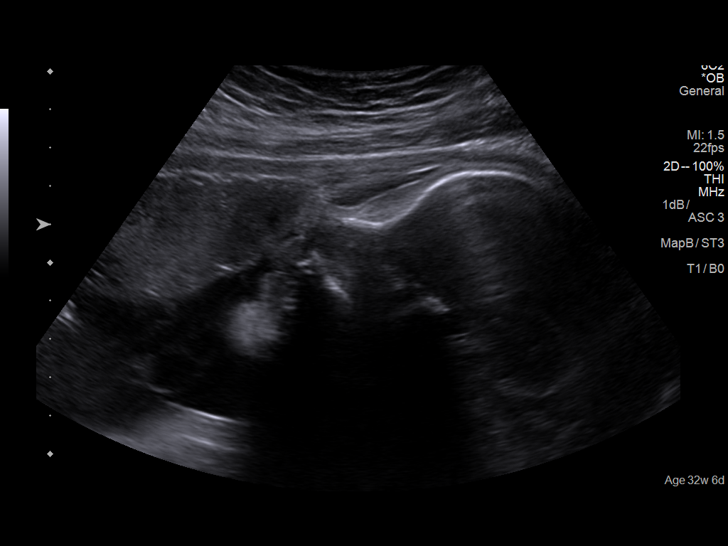
[im 58/92]
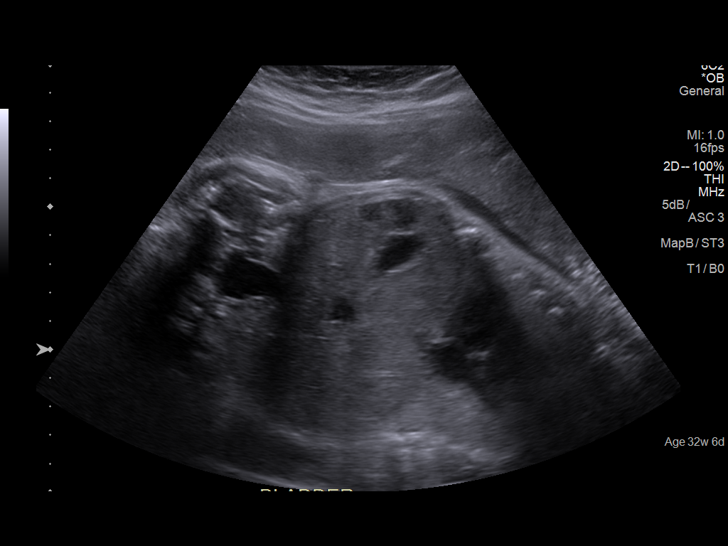
[im 65/92]
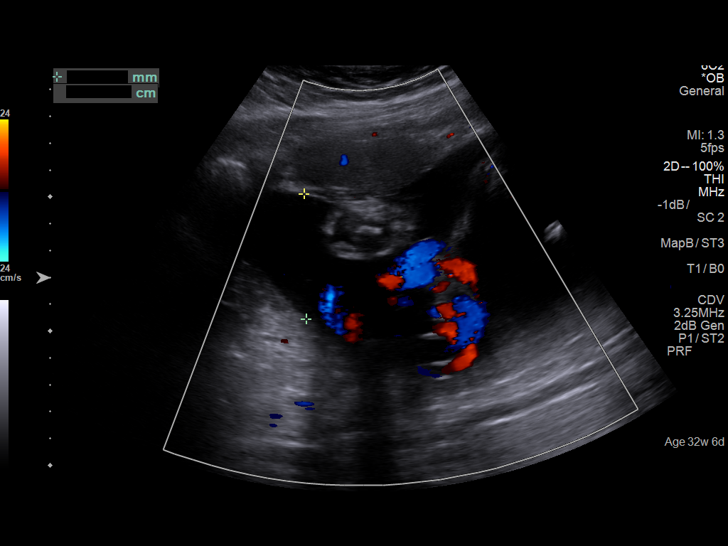
[im 75/92]
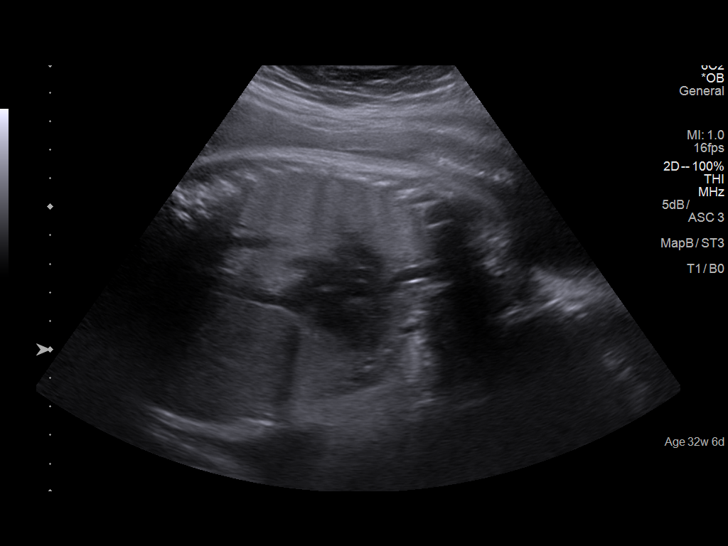
[im 81/92]
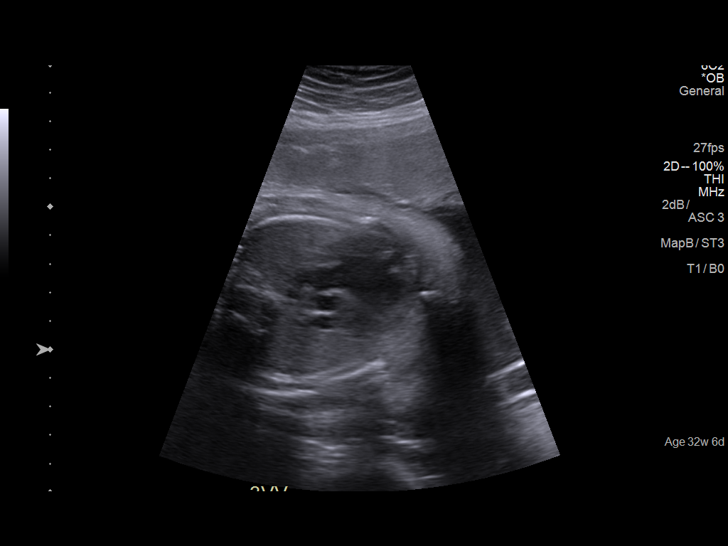
[im 88/92]
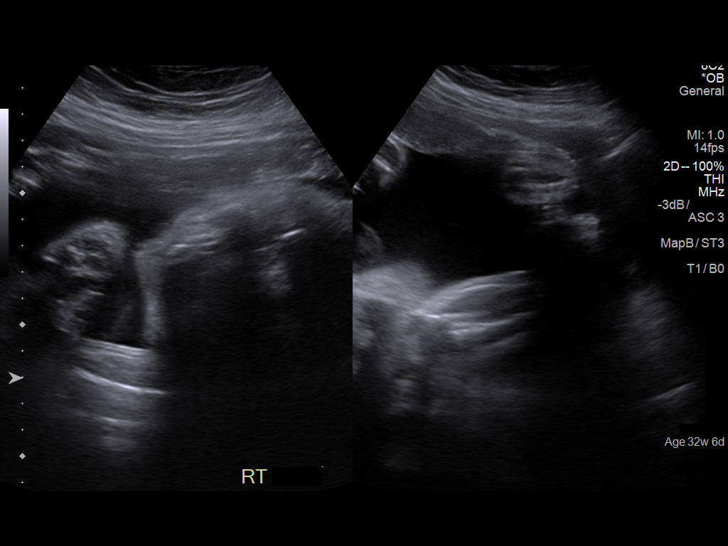

[12 of 28 positions shown; findings below may reference images not displayed]

IMPRESSION: Intrauterine pregnancy with a best estimated gestational age
of 32 weeks 6 days.  Dating based on earliest available
ultrasound performed at [facility] on 10/15/2017;
measurements were reported as 7 weeks 0 days.

Detailed ultrasound performed for the indiction of obesity
(BMI 32) and fetal arrythmia seen on prior ultrasound.

Due to fetal position and late gestational age, distal spine,
distal extremity position, abdominal cord insert and 3 vessel
trachea views of the heart were suboptimal.  Other fetal
anatomy visualized appears normal within the limitations of
ultrasound.

No fetal arrythmia appreciated on today's ultrasound.

Appropriate overall fetal growth, however AC is measuring
>97th percentile. Ms. Noraida had a normal glucola.

Maternal right ovary was visualized; left ovary was not seen.
No adnexal masses seen.

Findings were discussed.  Recommend avoidance of tobacco
products as well as caffeinated foods/beverages.  May
resume usual prenatal care.

## 2020-05-31 ENCOUNTER — Other Ambulatory Visit: Payer: Self-pay

## 2020-05-31 MED ORDER — LO LOESTRIN FE 1 MG-10 MCG / 10 MCG PO TABS: 1.0000 | ORAL_TABLET | Freq: Every day | ORAL | 3 refills | Status: DC

## 2020-06-03 ENCOUNTER — Other Ambulatory Visit: Payer: Self-pay

## 2020-06-03 MED ORDER — LO LOESTRIN FE 1 MG-10 MCG / 10 MCG PO TABS: 1.0000 | ORAL_TABLET | Freq: Every day | ORAL | 3 refills | Status: DC

## 2020-10-29 ENCOUNTER — Other Ambulatory Visit: Payer: Self-pay

## 2020-10-29 MED ORDER — LO LOESTRIN FE 1 MG-10 MCG / 10 MCG PO TABS: 1.0000 | ORAL_TABLET | Freq: Every day | ORAL | 3 refills | Status: DC

## 2020-10-29 NOTE — Telephone Encounter (Signed)
Pt calling; has sched appt for 11/28/20; refills eRx'd.  Pt aware by detailed msg.

## 2020-11-28 ENCOUNTER — Ambulatory Visit (INDEPENDENT_AMBULATORY_CARE_PROVIDER_SITE_OTHER): Payer: Medicaid Other | Admitting: Obstetrics & Gynecology

## 2020-11-28 ENCOUNTER — Other Ambulatory Visit: Payer: Self-pay

## 2020-11-28 ENCOUNTER — Encounter: Payer: Self-pay | Admitting: Obstetrics & Gynecology

## 2020-11-28 ENCOUNTER — Other Ambulatory Visit (HOSPITAL_COMMUNITY)
Admission: RE | Admit: 2020-11-28 | Discharge: 2020-11-28 | Disposition: A | Discharge status: Home / Self Care | Payer: Medicaid Other | Source: Ambulatory Visit | Attending: Obstetrics & Gynecology | Admitting: Obstetrics & Gynecology

## 2020-11-28 VITALS — BP 122/80 | Ht 59.0 in | Wt 169.0 lb

## 2020-11-28 DIAGNOSIS — Z01419 Encounter for gynecological examination (general) (routine) without abnormal findings: Secondary | ICD-10-CM

## 2020-11-28 DIAGNOSIS — Z3041 Encounter for surveillance of contraceptive pills: Secondary | ICD-10-CM | POA: Diagnosis not present

## 2020-11-28 DIAGNOSIS — Z124 Encounter for screening for malignant neoplasm of cervix: Secondary | ICD-10-CM

## 2020-11-28 MED ORDER — LO LOESTRIN FE 1 MG-10 MCG / 10 MCG PO TABS: 1.0000 | ORAL_TABLET | Freq: Every day | ORAL | 3 refills | Status: DC

## 2020-11-28 NOTE — Patient Instructions (Signed)
Thank you for choosing [facility]. As part of our ongoing efforts to improve patient experience, we would appreciate your feedback. Please fill out the short survey that you will receive by mail or MyChart. Your opinion is important to us! -Dr Chastelyn Athens  Recommendations to boost your immunity to prevent illness such as viral flu and colds, including covid19, are as follows:       - - -  Vitamin K2 and Vitamin D3  - - - Take Vitamin K2 at 200-300 mcg daily (usually 2-3 pills daily of the over the counter formulation). Take Vitamin D3 at 3000-4000 U daily (usually 3-4 pills daily of the over the counter formulation). Studies show that these two at high normal levels in your system are very effective in keeping your immunity so strong and protective that you will be unlikely to contract viral illness such as those listed above.  Dr Demario Faniel  

## 2020-11-28 NOTE — Progress Notes (Signed)
HPI:      [name] is a 25 y.o. G1P1001 who LMP was Patient's last menstrual period was 10/24/2020., she presents today for her annual examination. The patient has no complaints today.  Periods are more reg on OCP although she does occas skip one. The patient is sexually active (w FOB of her now 27 year old). Her last pap: was normal. The patient does perform self breast exams.  There is no notable family history of breast or ovarian cancer in her family.  The patient has regular exercise: yes.  The patient denies current symptoms of depression but does have anxiety, reports not able to get counseling due to insurance and cost.  No longer takes Lexapro.    GYN History: Contraception: OCP (estrogen/progesterone)  PMHx: Past Medical History:  Diagnosis Date  . Medical history non-contributory    Past Surgical History:  Procedure Laterality Date  . DENTAL SURGERY     Family History  Problem Relation Age of Onset  . Diabetes Mellitus II Maternal Aunt    Social History   Tobacco Use  . Smoking status: Never Smoker  . Smokeless tobacco: Never Used  Vaping Use  . Vaping Use: Never used  Substance Use Topics  . Alcohol use: No  . Drug use: No    Current Outpatient Medications:  .  Norethindrone-Ethinyl Estradiol-Fe Biphas (LO LOESTRIN FE) 1 MG-10 MCG / 10 MCG tablet, Take 1 tablet by mouth daily., Disp: 84 tablet, Rfl: 3 Allergies: Pecan pollen  Review of Systems  Constitutional: Positive for malaise/fatigue. Negative for chills and fever.  HENT: Negative for congestion, sinus pain and sore throat.   Eyes: Negative for blurred vision and pain.  Respiratory: Negative for cough and wheezing.   Cardiovascular: Negative for chest pain and leg swelling.  Gastrointestinal: Negative for abdominal pain, constipation, diarrhea, heartburn, nausea and vomiting.  Genitourinary: Negative for dysuria, frequency, hematuria and urgency.  Musculoskeletal: Negative for back pain, joint  pain, myalgias and neck pain.  Skin: Negative for itching and rash.  Neurological: Negative for dizziness, tremors and weakness.  Endo/Heme/Allergies: Does not bruise/bleed easily.  Psychiatric/Behavioral: Negative for depression. The patient is nervous/anxious. The patient does not have insomnia.     Objective: BP 122/80   Ht 4\' 11"  (1.499 m)   Wt 169 lb (76.7 kg)   LMP 10/24/2020   BMI 34.13 kg/m   Filed Weights   11/28/20 0815  Weight: 169 lb (76.7 kg)   Body mass index is 34.13 kg/m. Physical Exam Constitutional:      General: She is not in acute distress.    Appearance: She is well-developed.  Genitourinary:     Rectum normal.     No lesions in the vagina.     No vaginal bleeding.      Right Adnexa: not tender and no mass present.    Left Adnexa: not tender and no mass present.    No cervical motion tenderness, friability, lesion or polyp.     Uterus is not enlarged.     No uterine mass detected. Breasts:     Right: No mass, skin change or tenderness.     Left: No mass, skin change or tenderness.    HENT:     Head: Normocephalic and atraumatic. No laceration.     Right Ear: Hearing normal.     Left Ear: Hearing normal.     Mouth/Throat:     Pharynx: Uvula midline.  Eyes:  Pupils: Pupils are equal, round, and reactive to light.  Neck:     Thyroid: No thyromegaly.  Cardiovascular:     Rate and Rhythm: Normal rate and regular rhythm.     Heart sounds: No murmur heard. No friction rub. No gallop.   Pulmonary:     Effort: Pulmonary effort is normal. No respiratory distress.     Breath sounds: Normal breath sounds. No wheezing.  Abdominal:     General: Bowel sounds are normal. There is no distension.     Palpations: Abdomen is soft.     Tenderness: There is no abdominal tenderness. There is no rebound.  Musculoskeletal:        General: Normal range of motion.     Cervical back: Normal range of motion and neck supple.  Neurological:     Mental Status:  She is alert and oriented to person, place, and time.     Cranial Nerves: No cranial nerve deficit.  Skin:    General: Skin is warm and dry.  Psychiatric:        Judgment: Judgment normal.  Vitals reviewed.     Assessment:  ANNUAL EXAM 1. Women's annual routine gynecological examination   2. Screening for cervical cancer   3. Encounter for surveillance of contraceptive pills      Screening Plan:            1.  Cervical Screening-  Pap smear done today  2.  Counseling for contraception: oral contraceptives (estrogen/progesterone)  Upstream - 11/28/20 0817      Pregnancy Intention Screening   Does the patient want to become pregnant in the next year? No    Does the patient's partner want to become pregnant in the next year? No    Would the patient like to discuss contraceptive options today? No           Encounter for surveillance of contraceptive pills - Norethindrone-Ethinyl Estradiol-Fe Biphas (LO LOESTRIN FE) 1 MG-10 MCG / 10 MCG tablet; Take 1 tablet by mouth daily.  Dispense: 84 tablet; Refill: 3      F/U  Return in about 1 year (around 11/28/2021) for Annual.  Annamarie Major, MD, [facility], [facility] 11/28/2020  8:38 AM

## 2020-11-29 LAB — CYTOLOGY - PAP: Diagnosis: NEGATIVE

## 2021-04-29 ENCOUNTER — Other Ambulatory Visit: Payer: Self-pay | Admitting: Obstetrics & Gynecology

## 2021-04-29 DIAGNOSIS — Z3041 Encounter for surveillance of contraceptive pills: Secondary | ICD-10-CM

## 2021-04-29 MED ORDER — LO LOESTRIN FE 1 MG-10 MCG / 10 MCG PO TABS: 1.0000 | ORAL_TABLET | Freq: Every day | ORAL | 1 refills | Status: DC

## 2022-01-12 ENCOUNTER — Ambulatory Visit
Admission: EM | Admit: 2022-01-12 | Discharge: 2022-01-12 | Disposition: A | Discharge status: Home / Self Care | Payer: Medicaid Other | Attending: Physician Assistant | Admitting: Physician Assistant

## 2022-01-12 ENCOUNTER — Other Ambulatory Visit: Payer: Self-pay

## 2022-01-12 DIAGNOSIS — H1031 Unspecified acute conjunctivitis, right eye: Secondary | ICD-10-CM | POA: Diagnosis not present

## 2022-01-12 DIAGNOSIS — H1032 Unspecified acute conjunctivitis, left eye: Secondary | ICD-10-CM | POA: Diagnosis not present

## 2022-01-12 MED ORDER — CIPROFLOXACIN HCL 0.3 % OP SOLN: OPHTHALMIC | 0 refills | Status: AC

## 2022-01-12 MED ORDER — KETOROLAC TROMETHAMINE 0.5 % OP SOLN: 1.0000 [drp] | Freq: Four times a day (QID) | OPHTHALMIC | 0 refills | Status: DC | PRN

## 2022-01-12 NOTE — Discharge Instructions (Addendum)
-  Condition is likely bacterial conjunctivitis so I have sent antibiotic eyedrops as well as another eyedrop to help with pain and inflammation. - Cool compresses and can take ibuprofen and Tylenol as needed for pain relief. - Return if no improvement in the next 3 days or worsening of symptoms especially if you start to have increased drainage from the eyes or vision changes.  May also consider reaching out to [facility] for evaluation if no improvement in the next couple days.

## 2022-01-12 NOTE — ED Provider Notes (Signed)
[facility]    CSN: [id number] Arrival date & time: 01/12/22  5638      History   Chief Complaint Chief Complaint  Patient presents with   Eye Problem    HPI Alyssa Duffy is a 26 y.o. female presenting for bilateral eye redness but especially redness of the right eye for the past 3 days.  Patient also reports yellowish crusting and drainage from both eyes.  Reports symptoms seem to get a little better with redness eyedrops over the weekend but today she woke up with increased redness in the right eye and sharp stabbing pains in the corners of both eyelids.  No associated vision changes.  Some sensitivity to sunlight.  Patient never had any injury to the eye.  She does wear glasses and does not wear contacts.  No other symptoms reported.  HPI  Past Medical History:  Diagnosis Date   Medical history non-contributory     Patient Active Problem List   Diagnosis Date Noted   Postpartum care following vaginal delivery 06/04/2018   BMI 30.0-30.9,adult 10/04/2017    Past Surgical History:  Procedure Laterality Date   DENTAL SURGERY      OB History     Gravida  1   Para  1   Term  1   Preterm      AB      Living  1      SAB      IAB      Ectopic      Multiple  0   Live Births  1            Home Medications    Prior to Admission medications   Medication Sig Start Date End Date Taking? Authorizing Provider  ciprofloxacin (CILOXAN) 0.3 % ophthalmic solution Administer 1 drop, every 2 hours, while awake, for 2 days. Then 1 drop, every 4 hours, while awake, for the next 5 days. 01/12/22 01/19/22 Yes Shirlee Latch, PA-C  ketorolac (ACULAR) 0.5 % ophthalmic solution Place 1 drop into both eyes 4 (four) times daily as needed (pain, inflammation). 01/12/22  Yes Eusebio Friendly B, PA-C  Norethindrone-Ethinyl Estradiol-Fe Biphas (LO LOESTRIN FE) 1 MG-10 MCG / 10 MCG tablet Take 1 tablet by mouth daily. 04/29/21 07/22/21  Nadara Mustard, MD     Family History Family History  Problem Relation Age of Onset   Diabetes Mellitus II Maternal Aunt     Social History Social History   Tobacco Use   Smoking status: Never   Smokeless tobacco: Never  Vaping Use   Vaping Use: Some days  Substance Use Topics   Alcohol use: Yes    Comment: rare   Drug use: No     Allergies   Pecan pollen   Review of Systems Review of Systems  Constitutional:  Negative for fever.  HENT:  Negative for congestion.   Eyes:  Positive for photophobia (to sunlight but not artificial light), pain, discharge and redness. Negative for visual disturbance.  Respiratory:  Negative for cough.   Neurological:  Negative for dizziness and headaches.     Physical Exam Triage Vital Signs ED Triage Vitals  Enc Vitals Group     BP 01/12/22 0833 137/63     Pulse Rate 01/12/22 0833 85     Resp 01/12/22 0833 16     Temp 01/12/22 0833 98.1 F (36.7 C)     Temp Source 01/12/22 0833 Oral     SpO2 01/12/22  0833 97 %     Weight 01/12/22 0830 156 lb (70.8 kg)     Height 01/12/22 0830 4\' 11"  (1.499 m)     Head Circumference --      Peak Flow --      Pain Score 01/12/22 0829 4     Pain Loc --      Pain Edu? --      Excl. in GC? --    No data found.  Updated Vital Signs BP 137/63 (BP Location: Left Arm)   Pulse 85   Temp 98.1 F (36.7 C) (Oral)   Resp 16   Ht 4\' 11"  (1.499 m)   Wt 156 lb (70.8 kg)   LMP 12/18/2021   SpO2 97%   BMI 31.51 kg/m   Visual Acuity Right Eye Distance:  20/40 Left Eye Distance: 20/50  Bilateral Distance:  20/30  Corrected vision.  Wears glasses.  Physical Exam Vitals and nursing note reviewed.  Constitutional:      General: She is not in acute distress.    Appearance: Normal appearance. She is not ill-appearing or toxic-appearing.  HENT:     Head: Normocephalic and atraumatic.     Nose: Nose normal.     Mouth/Throat:     Mouth: Mucous membranes are moist.     Pharynx: Oropharynx is clear.  Eyes:      General: No scleral icterus.       Right eye: Discharge (light yellowish drainage in corner of right eye) present.        Left eye: No discharge.     Extraocular Movements: Extraocular movements intact.     Conjunctiva/sclera:     Right eye: Right conjunctiva is injected.     Left eye: Left conjunctiva is injected.     Pupils: Pupils are equal, round, and reactive to light.  Cardiovascular:     Rate and Rhythm: Normal rate.  Pulmonary:     Effort: Pulmonary effort is normal. No respiratory distress.  Musculoskeletal:     Cervical back: Neck supple.  Skin:    General: Skin is dry.  Neurological:     General: No focal deficit present.     Mental Status: She is alert. Mental status is at baseline.     Motor: No weakness.     Gait: Gait normal.  Psychiatric:        Mood and Affect: Mood normal.        Behavior: Behavior normal.        Thought Content: Thought content normal.      UC Treatments / Results  Labs (all labs ordered are listed, but only abnormal results are displayed) Labs Reviewed - No data to display  EKG   Radiology No results found.  Procedures Procedures (including critical care time)  Medications Ordered in UC Medications - No data to display  Initial Impression / Assessment and Plan / UC Course  I have reviewed the triage vital signs and the nursing notes.  Pertinent labs & imaging results that were available during my care of the patient were reviewed by me and considered in my medical decision making (see chart for details).  26 year old female presenting for bilateral eye redness and yellowish crusting/drainage as well as sharp pains of eyelids for the past couple of days.  Initial improvement with redness relief eyedrops but then worsening symptoms upon waking a couple of hours ago.  No injury to eyes.  Not a contact lens wearer.  No known exposure  to conjunctivitis.  Vision is intact.  Patient is overall well-appearing.  She does have injection of  bilateral conjunctiva but more so on the right.  Yellowish crusty drainage in the corner of the right eye.  No foreign bodies noted.  No obvious corneal abrasions noted.  Symptoms likely consistent with bacterial conjunctivitis.  Will treat with Ciloxan and ketorolac eyedrops.  Encouraged cool compresses.  Reviewed return and ER precautions as well as following up with [facility] if no improvement in the next couple days.   Final Clinical Impressions(s) / UC Diagnoses   Final diagnoses:  Acute bacterial conjunctivitis of right eye  Acute bacterial conjunctivitis of left eye     Discharge Instructions      -Condition is likely bacterial conjunctivitis so I have sent antibiotic eyedrops as well as another eyedrop to help with pain and inflammation. - Cool compresses and can take ibuprofen and Tylenol as needed for pain relief. - Return if no improvement in the next 3 days or worsening of symptoms especially if you start to have increased drainage from the eyes or vision changes.  May also consider reaching out to [facility] for evaluation if no improvement in the next couple days.    ED Prescriptions     Medication Sig Dispense Auth. Provider   ciprofloxacin (CILOXAN) 0.3 % ophthalmic solution Administer 1 drop, every 2 hours, while awake, for 2 days. Then 1 drop, every 4 hours, while awake, for the next 5 days. 5 mL Eusebio Friendly B, PA-C   ketorolac (ACULAR) 0.5 % ophthalmic solution Place 1 drop into both eyes 4 (four) times daily as needed (pain, inflammation). 5 mL Shirlee Latch, PA-C      PDMP not reviewed this encounter.   Shirlee Latch, PA-C 01/12/22 [id number]

## 2022-01-12 NOTE — ED Triage Notes (Addendum)
Pt states she has been draining both eyes and looks like boogers.  Started on Thursday. Now having sharp pain in both eyes from tear ducts.  VISUAL ACUITY: Corrected left eye 20/50 Corrected right eye 20/40 Corrected both eyes 20/30 -1

## 2022-01-17 ENCOUNTER — Ambulatory Visit
Admission: RE | Admit: 2022-01-17 | Discharge: 2022-01-17 | Disposition: A | Discharge status: Home / Self Care | Payer: Medicaid Other | Source: Ambulatory Visit | Attending: Physician Assistant | Admitting: Physician Assistant

## 2022-01-17 VITALS — BP 117/53 | HR 72 | Temp 98.6°F | Resp 18

## 2022-01-17 DIAGNOSIS — J069 Acute upper respiratory infection, unspecified: Secondary | ICD-10-CM

## 2022-01-17 DIAGNOSIS — R051 Acute cough: Secondary | ICD-10-CM | POA: Diagnosis not present

## 2022-01-17 DIAGNOSIS — R0981 Nasal congestion: Secondary | ICD-10-CM

## 2022-01-17 DIAGNOSIS — J029 Acute pharyngitis, unspecified: Secondary | ICD-10-CM | POA: Diagnosis not present

## 2022-01-17 LAB — GROUP A STREP BY PCR: Group A Strep by PCR: NOT DETECTED

## 2022-01-17 MED ORDER — BENZONATATE 200 MG PO CAPS: 200.0000 mg | ORAL_CAPSULE | Freq: Three times a day (TID) | ORAL | 0 refills | Status: DC | PRN

## 2022-01-17 MED ORDER — PROMETHAZINE-DM 6.25-15 MG/5ML PO SYRP: 5.0000 mL | ORAL_SOLUTION | Freq: Four times a day (QID) | ORAL | 0 refills | Status: DC | PRN

## 2022-01-17 MED ORDER — LIDOCAINE VISCOUS HCL 2 % MT SOLN: 15.0000 mL | OROMUCOSAL | 0 refills | Status: DC | PRN

## 2022-01-17 MED ORDER — IPRATROPIUM BROMIDE 0.06 % NA SOLN: 2.0000 | Freq: Four times a day (QID) | NASAL | 0 refills | Status: DC

## 2022-01-17 NOTE — ED Triage Notes (Signed)
Pt present sore throat with cough, symptom started a week ago. Pt tried OTC medication with no relief. Pt states one side hurt more then the other.

## 2022-01-25 NOTE — Progress Notes (Signed)
PCP:  Patient, No Pcp Per   Chief Complaint  Patient presents with   Gynecologic Exam     HPI:      Alyssa Duffy is a 26 y.o. G1P1001 whose LMP was Patient's last menstrual period was 01/18/2022 (exact date)., presents today for her annual examination.  Her menses are regular every 28-30 days, lasting 2-8 days, light flow, on OCPs.  Dysmenorrhea mild, improved since pregnancy and on OCPs. She does not have intermenstrual bleeding.  Sex activity: single partner, contraception - OCP (estrogen/progesterone). No pain/bleeding. Last Pap: 11/28/20 Results were: no abnormalities ; no hx of abn paps  There is no FH of breast cancer. There is no FH of ovarian cancer. The patient does do self-breast exams.  Tobacco use: smokes cigs or vapes sometimes; done more with anxiety, not daily Alcohol use: none No drug use.  Exercise: moderately active  She does get adequate calcium but not Vitamin D in her diet.  Pt with issues of anxiety/depression for many yrs, worse PP till now. Feels like she's had depression since age 45.  Still having sx, some days worse than others. Does have SI regularly but wouldn't carry it out. Was started on lexapro in the past which increased SI, so pt told to stop it by Dr. Tiburcio Pea. Hasn't done meds since. Having panic attacks at times; still with SI but states wouldn't do it. Hasn't seen therapist. Strong FH anxiety and depression.   Also with long hx of urinary frequency/urgency with and without good flow. Drinks caffeine and a good bit of water. Sx worse recently but denies dysuria, hematuria, LBP.   Patient Active Problem List   Diagnosis Date Noted   Postpartum care following vaginal delivery 06/04/2018   BMI 30.0-30.9,adult 10/04/2017    Past Surgical History:  Procedure Laterality Date   DENTAL SURGERY      Family History  Problem Relation Age of Onset   Diabetes Mellitus II Maternal Aunt     Social History   Socioeconomic History   Marital  status: Significant Other    Spouse name: Franky Macho   Number of children: Not on file   Years of education: Not on file   Highest education level: Not on file  Occupational History   Occupation: [employer]  Tobacco Use   Smoking status: Never   Smokeless tobacco: Never  Vaping Use   Vaping Use: Some days  Substance and Sexual Activity   Alcohol use: Yes    Comment: rare   Drug use: No   Sexual activity: Yes    Birth control/protection: Pill  Other Topics Concern   Not on file  Social History Narrative   Not on file   Social Determinants of Health   Financial Resource Strain: Not on file  Food Insecurity: Not on file  Transportation Needs: Not on file  Physical Activity: Not on file  Stress: Not on file  Social Connections: Not on file  Intimate Partner Violence: Not on file     Current Outpatient Medications:    ipratropium (ATROVENT) 0.06 % nasal spray, Place 2 sprays into both nostrils 4 (four) times daily., Disp: 15 mL, Rfl: 0   promethazine-dextromethorphan (PROMETHAZINE-DM) 6.25-15 MG/5ML syrup, Take 5 mLs by mouth 4 (four) times daily as needed., Disp: 118 mL, Rfl: 0   venlafaxine XR (EFFEXOR XR) 37.5 MG 24 hr capsule, Take 1 capsule (37.5 mg total) by mouth daily with breakfast for 7 days., Disp: 7 capsule, Rfl: 0   venlafaxine  XR (EFFEXOR-XR) 75 MG 24 hr capsule, Take 1 capsule (75 mg total) by mouth daily., Disp: 30 capsule, Rfl: 1   benzonatate (TESSALON) 200 MG capsule, Take 1 capsule (200 mg total) by mouth 3 (three) times daily as needed for cough., Disp: 30 capsule, Rfl: 0   Norethindrone-Ethinyl Estradiol-Fe Biphas (LO LOESTRIN FE) 1 MG-10 MCG / 10 MCG tablet, Take 1 tablet by mouth daily., Disp: 84 tablet, Rfl: 3     ROS:  Review of Systems  Constitutional:  Positive for fatigue. Negative for fever and unexpected weight change.  Respiratory:  Negative for cough, shortness of breath and wheezing.   Cardiovascular:  Negative for chest pain, palpitations and  leg swelling.  Gastrointestinal:  Negative for blood in stool, constipation, diarrhea, nausea and vomiting.  Endocrine: Negative for cold intolerance, heat intolerance and polyuria.  Genitourinary:  Positive for frequency and urgency. Negative for dyspareunia, dysuria, flank pain, genital sores, hematuria, menstrual problem, pelvic pain, vaginal bleeding, vaginal discharge and vaginal pain.  Musculoskeletal:  Negative for back pain, joint swelling and myalgias.  Skin:  Negative for rash.  Neurological:  Positive for headaches. Negative for dizziness, syncope, light-headedness and numbness.  Hematological:  Negative for adenopathy.  Psychiatric/Behavioral:  Positive for agitation and dysphoric mood. Negative for confusion, sleep disturbance and suicidal ideas. The patient is not nervous/anxious.    BREAST: No symptoms   Objective: BP (!) 92/58   Ht 4\' 11"  (1.499 m)   Wt 156 lb (70.8 kg)   LMP 01/18/2022 (Exact Date)   BMI 31.51 kg/m    Physical Exam Constitutional:      Appearance: She is well-developed.  Genitourinary:     Vulva normal.     Right Labia: No rash, tenderness or lesions.    Left Labia: No tenderness, lesions or rash.    No vaginal discharge, erythema or tenderness.      Right Adnexa: not tender and no mass present.    Left Adnexa: not tender and no mass present.    No cervical friability or polyp.     Uterus is not enlarged or tender.  Breasts:    Right: No mass, nipple discharge, skin change or tenderness.     Left: No mass, nipple discharge, skin change or tenderness.  Neck:     Thyroid: No thyromegaly.  Cardiovascular:     Rate and Rhythm: Normal rate and regular rhythm.     Heart sounds: Normal heart sounds. No murmur heard. Pulmonary:     Effort: Pulmonary effort is normal.     Breath sounds: Normal breath sounds.  Abdominal:     Palpations: Abdomen is soft.     Tenderness: There is no abdominal tenderness. There is no guarding or rebound.   Musculoskeletal:        General: Normal range of motion.     Cervical back: Normal range of motion.  Lymphadenopathy:     Cervical: No cervical adenopathy.  Neurological:     General: No focal deficit present.     Mental Status: She is alert and oriented to person, place, and time.     Cranial Nerves: No cranial nerve deficit.  Skin:    General: Skin is warm and dry.  Psychiatric:        Mood and Affect: Mood normal.        Behavior: Behavior normal.        Thought Content: Thought content normal.        Judgment: Judgment normal.  Vitals  reviewed.     Results: Results for orders placed or performed in visit on 01/26/22 (from the past 24 hour(s))  POCT Urinalysis Dipstick     Status: Abnormal   Collection Time: 01/26/22  1:46 PM  Result Value Ref Range   Color, UA pale    Clarity, UA clear    Glucose, UA Negative Negative   Bilirubin, UA neg    Ketones, UA neg    Spec Grav, UA 1.025 1.010 - 1.025   Blood, UA small    pH, UA 6.0 5.0 - 8.0   Protein, UA Negative Negative   Urobilinogen, UA     Nitrite, UA neg    Leukocytes, UA Small (1+) (A) Negative   Appearance     Odor         01/26/2022    2:04 PM  GAD 7 : Generalized Anxiety Score  Nervous, Anxious, on Edge 2  Control/stop worrying 2  Worry too much - different things 3  Trouble relaxing 1  Restless 2  Easily annoyed or irritable 3  Afraid - awful might happen 3  Total GAD 7 Score 16  Anxiety Difficulty Somewhat difficult       01/26/2022    2:04 PM  Depression screen PHQ 2/9  Decreased Interest 1  Down, Depressed, Hopeless 2  PHQ - 2 Score 3  Altered sleeping 2  Tired, decreased energy 2  Change in appetite 1  Feeling bad or failure about yourself  3  Trouble concentrating 1  Moving slowly or fidgety/restless 1  Suicidal thoughts 3  PHQ-9 Score 16  Difficult doing work/chores Somewhat difficult     Assessment/Plan: Encounter for annual routine gynecological examination  Encounter for  surveillance of contraceptive pills - Plan: Norethindrone-Ethinyl Estradiol-Fe Biphas (LO LOESTRIN FE) 1 MG-10 MCG / 10 MCG tablet; OCP RF  Urinary frequency - Plan: POCT Urinalysis Dipstick, Urine Culture; pos sx and mildly positive UA, check C&S. Will f/u with results if positive. Increase water, d/c caffeine.   Anxiety and depression - Plan: venlafaxine XR (EFFEXOR XR) 37.5 MG 24 hr capsule, venlafaxine XR (EFFEXOR-XR) 75 MG 24 hr capsule, Ambulatory referral to Psychiatry; discussed tx and referral to therapist. Rx effexor, wean on. Increase exercise. RTO in 6 wks for f/u/sooner prn. Pt knows to call 911 if suicidal and has friend she promises to reach out to if sx occur. Aware to d/c effexor if SI sx worsen.   Meds ordered this encounter  Medications   Norethindrone-Ethinyl Estradiol-Fe Biphas (LO LOESTRIN FE) 1 MG-10 MCG / 10 MCG tablet    Sig: Take 1 tablet by mouth daily.    Dispense:  84 tablet    Refill:  3    Order Specific Question:   Supervising Provider    Answer:   Hildred Laser [[id number]]   venlafaxine XR (EFFEXOR XR) 37.5 MG 24 hr capsule    Sig: Take 1 capsule (37.5 mg total) by mouth daily with breakfast for 7 days.    Dispense:  7 capsule    Refill:  0    Order Specific Question:   Supervising Provider    Answer:   Hildred Laser [[id number]]   venlafaxine XR (EFFEXOR-XR) 75 MG 24 hr capsule    Sig: Take 1 capsule (75 mg total) by mouth daily.    Dispense:  30 capsule    Refill:  1    Order Specific Question:   Supervising Provider    Answer:   Hildred Laser [[id number]]  GYN counsel adequate intake of calcium and vitamin D, diet and exercise     F/U  Return in about 6 weeks (around 03/09/2022) for anxiety/depression f/u.  Child Campoy B. Elize Pinon, PA-C 01/26/2022 4:08 PM

## 2022-01-26 ENCOUNTER — Ambulatory Visit (INDEPENDENT_AMBULATORY_CARE_PROVIDER_SITE_OTHER): Payer: Medicaid Other | Admitting: Obstetrics and Gynecology

## 2022-01-26 ENCOUNTER — Encounter: Payer: Self-pay | Admitting: Obstetrics and Gynecology

## 2022-01-26 VITALS — BP 92/58 | Ht 59.0 in | Wt 156.0 lb

## 2022-01-26 DIAGNOSIS — R350 Frequency of micturition: Secondary | ICD-10-CM

## 2022-01-26 DIAGNOSIS — F32A Depression, unspecified: Secondary | ICD-10-CM

## 2022-01-26 DIAGNOSIS — Z01419 Encounter for gynecological examination (general) (routine) without abnormal findings: Secondary | ICD-10-CM | POA: Diagnosis not present

## 2022-01-26 DIAGNOSIS — Z3041 Encounter for surveillance of contraceptive pills: Secondary | ICD-10-CM | POA: Diagnosis not present

## 2022-01-26 DIAGNOSIS — F419 Anxiety disorder, unspecified: Secondary | ICD-10-CM

## 2022-01-26 LAB — POCT URINALYSIS DIPSTICK
Bilirubin, UA: NEGATIVE
Glucose, UA: NEGATIVE
Ketones, UA: NEGATIVE
Nitrite, UA: NEGATIVE
Protein, UA: NEGATIVE
Spec Grav, UA: 1.025 (ref 1.010–1.025)
pH, UA: 6 (ref 5.0–8.0)

## 2022-01-26 MED ORDER — VENLAFAXINE HCL ER 37.5 MG PO CP24: 37.5000 mg | ORAL_CAPSULE | Freq: Every day | ORAL | 0 refills | Status: DC

## 2022-01-26 MED ORDER — VENLAFAXINE HCL ER 75 MG PO CP24: 75.0000 mg | ORAL_CAPSULE | Freq: Every day | ORAL | 1 refills | Status: DC

## 2022-01-26 MED ORDER — LO LOESTRIN FE 1 MG-10 MCG / 10 MCG PO TABS: 1.0000 | ORAL_TABLET | Freq: Every day | ORAL | 3 refills | Status: DC

## 2022-01-26 NOTE — Patient Instructions (Signed)
I value your feedback and you entrusting us with your care. If you get a  patient survey, I would appreciate you taking the time to let us know about your experience today. Thank you! ? ? ?

## 2022-01-28 LAB — URINE CULTURE

## 2022-01-30 ENCOUNTER — Other Ambulatory Visit: Payer: Self-pay | Admitting: Obstetrics and Gynecology

## 2022-01-30 DIAGNOSIS — F32A Depression, unspecified: Secondary | ICD-10-CM

## 2022-02-24 ENCOUNTER — Other Ambulatory Visit: Payer: Self-pay | Admitting: Obstetrics and Gynecology

## 2022-02-24 DIAGNOSIS — F32A Depression, unspecified: Secondary | ICD-10-CM

## 2022-02-24 NOTE — Telephone Encounter (Signed)
Contacted pt to schedule medication f/u appt.  Left message for pt to call back.  Also sent a MyChart message to the pt.

## 2022-03-29 NOTE — Progress Notes (Unsigned)
    Patient, No Pcp Per   No chief complaint on file.   HPI:      [name] is a 26 y.o. G1P1001 whose LMP was No LMP recorded. (Menstrual status: Oral contraceptives)., presents today for anxiety/depression f/u from 7/23, referred to psych, started on effexor    Patient Active Problem List   Diagnosis Date Noted   Postpartum care following vaginal delivery 06/04/2018   BMI 30.0-30.9,adult 10/04/2017    Past Surgical History:  Procedure Laterality Date   DENTAL SURGERY      Family History  Problem Relation Age of Onset   Diabetes Mellitus II Maternal Aunt     Social History   Socioeconomic History   Marital status: Significant Other    Spouse name: Franky Macho   Number of children: Not on file   Years of education: Not on file   Highest education level: Not on file  Occupational History   Occupation: [employer]  Tobacco Use   Smoking status: Never   Smokeless tobacco: Never  Vaping Use   Vaping Use: Some days  Substance and Sexual Activity   Alcohol use: Yes    Comment: rare   Drug use: No   Sexual activity: Yes    Birth control/protection: Pill  Other Topics Concern   Not on file  Social History Narrative   Not on file   Social Determinants of Health   Financial Resource Strain: Not on file  Food Insecurity: Not on file  Transportation Needs: Not on file  Physical Activity: Not on file  Stress: Not on file  Social Connections: Not on file  Intimate Partner Violence: Not on file    Outpatient Medications Prior to Visit  Medication Sig Dispense Refill   benzonatate (TESSALON) 200 MG capsule Take 1 capsule (200 mg total) by mouth 3 (three) times daily as needed for cough. 30 capsule 0   ipratropium (ATROVENT) 0.06 % nasal spray Place 2 sprays into both nostrils 4 (four) times daily. 15 mL 0   Norethindrone-Ethinyl Estradiol-Fe Biphas (LO LOESTRIN FE) 1 MG-10 MCG / 10 MCG tablet Take 1 tablet by mouth daily. 84 tablet 3    promethazine-dextromethorphan (PROMETHAZINE-DM) 6.25-15 MG/5ML syrup Take 5 mLs by mouth 4 (four) times daily as needed. 118 mL 0   venlafaxine XR (EFFEXOR XR) 37.5 MG 24 hr capsule Take 1 capsule (37.5 mg total) by mouth daily with breakfast for 7 days. 7 capsule 0   venlafaxine XR (EFFEXOR-XR) 75 MG 24 hr capsule Take 1 capsule (75 mg total) by mouth daily. 30 capsule 1   No facility-administered medications prior to visit.      ROS:  Review of Systems BREAST: No symptoms   OBJECTIVE:   Vitals:  There were no vitals taken for this visit.  Physical Exam  Results: No results found for this or any previous visit (from the past 24 hour(s)).   Assessment/Plan: No diagnosis found.    No orders of the defined types were placed in this encounter.     No follow-ups on file.  Sahmir Weatherbee B. Kainat Pizana, PA-C 03/29/2022 10:01 AM

## 2022-03-31 ENCOUNTER — Ambulatory Visit (INDEPENDENT_AMBULATORY_CARE_PROVIDER_SITE_OTHER): Payer: Medicaid Other | Admitting: Obstetrics and Gynecology

## 2022-03-31 ENCOUNTER — Encounter: Payer: Self-pay | Admitting: Obstetrics and Gynecology

## 2022-03-31 VITALS — BP 116/80 | Ht 59.0 in | Wt 148.0 lb

## 2022-03-31 DIAGNOSIS — F32A Depression, unspecified: Secondary | ICD-10-CM | POA: Diagnosis not present

## 2022-03-31 DIAGNOSIS — Z113 Encounter for screening for infections with a predominantly sexual mode of transmission: Secondary | ICD-10-CM | POA: Diagnosis not present

## 2022-03-31 DIAGNOSIS — F419 Anxiety disorder, unspecified: Secondary | ICD-10-CM

## 2022-03-31 MED ORDER — VENLAFAXINE HCL ER 150 MG PO CP24: 150.0000 mg | ORAL_CAPSULE | Freq: Every day | ORAL | 0 refills | Status: DC

## 2022-04-01 LAB — HEPATITIS C ANTIBODY: Hep C Virus Ab: NONREACTIVE

## 2022-04-24 ENCOUNTER — Other Ambulatory Visit: Payer: Self-pay | Admitting: Obstetrics and Gynecology

## 2022-04-24 DIAGNOSIS — F419 Anxiety disorder, unspecified: Secondary | ICD-10-CM

## 2022-04-25 ENCOUNTER — Other Ambulatory Visit: Payer: Self-pay | Admitting: Obstetrics and Gynecology

## 2022-04-25 DIAGNOSIS — F419 Anxiety disorder, unspecified: Secondary | ICD-10-CM

## 2022-04-27 ENCOUNTER — Encounter: Payer: Self-pay | Admitting: Obstetrics and Gynecology

## 2022-04-27 ENCOUNTER — Other Ambulatory Visit: Payer: Self-pay | Admitting: Obstetrics and Gynecology

## 2022-04-27 DIAGNOSIS — F419 Anxiety disorder, unspecified: Secondary | ICD-10-CM

## 2022-04-27 MED ORDER — VENLAFAXINE HCL ER 150 MG PO CP24
150.0000 mg | ORAL_CAPSULE | Freq: Every day | ORAL | 2 refills | Status: DC
Start: 2022-04-27 — End: 2022-12-28

## 2022-04-27 NOTE — Progress Notes (Signed)
Rx RF effexor 150 mg dose till 7/24 annual. GAD=8, PHQ=6. Doing well

## 2022-04-27 NOTE — Telephone Encounter (Signed)
Pls call pt to do GAD/PHQ for sx on effexor. Pls ask her if she is doing well with 150 mg dose and having any side effects. If good, will send in RF. Thx.

## 2022-06-16 ENCOUNTER — Encounter: Payer: Self-pay | Admitting: Obstetrics and Gynecology

## 2022-06-16 ENCOUNTER — Other Ambulatory Visit: Payer: Self-pay | Admitting: Obstetrics and Gynecology

## 2022-06-16 DIAGNOSIS — Z3041 Encounter for surveillance of contraceptive pills: Secondary | ICD-10-CM

## 2022-08-18 ENCOUNTER — Encounter: Payer: Self-pay | Admitting: Obstetrics and Gynecology

## 2022-09-16 ENCOUNTER — Encounter: Payer: Self-pay | Admitting: Obstetrics and Gynecology

## 2022-10-06 ENCOUNTER — Encounter: Payer: Self-pay | Admitting: Obstetrics and Gynecology

## 2022-10-06 ENCOUNTER — Ambulatory Visit (INDEPENDENT_AMBULATORY_CARE_PROVIDER_SITE_OTHER): Payer: Medicaid Other | Admitting: Obstetrics and Gynecology

## 2022-10-06 ENCOUNTER — Other Ambulatory Visit (HOSPITAL_COMMUNITY)
Admission: RE | Admit: 2022-10-06 | Discharge: 2022-10-06 | Disposition: A | Discharge status: Home / Self Care | Payer: Medicaid Other | Source: Ambulatory Visit | Attending: Obstetrics and Gynecology | Admitting: Obstetrics and Gynecology

## 2022-10-06 VITALS — BP 102/60 | Ht 59.0 in | Wt 145.0 lb

## 2022-10-06 DIAGNOSIS — F32A Depression, unspecified: Secondary | ICD-10-CM | POA: Insufficient documentation

## 2022-10-06 DIAGNOSIS — R232 Flushing: Secondary | ICD-10-CM | POA: Diagnosis not present

## 2022-10-06 DIAGNOSIS — F419 Anxiety disorder, unspecified: Secondary | ICD-10-CM | POA: Diagnosis not present

## 2022-10-06 DIAGNOSIS — N921 Excessive and frequent menstruation with irregular cycle: Secondary | ICD-10-CM | POA: Diagnosis not present

## 2022-10-06 DIAGNOSIS — Z113 Encounter for screening for infections with a predominantly sexual mode of transmission: Secondary | ICD-10-CM

## 2022-10-06 MED ORDER — DROSPIRENONE-ETHINYL ESTRADIOL 3-0.02 MG PO TABS: 1.0000 | ORAL_TABLET | Freq: Every day | ORAL | 0 refills | Status: DC

## 2022-10-06 NOTE — Patient Instructions (Signed)
I value your feedback and you entrusting us with your care. If you get a [facility] patient survey, I would appreciate you taking the time to let us know about your experience today. Thank you! ? ? ?

## 2022-10-06 NOTE — Progress Notes (Signed)
Patient, No Pcp Per   Chief Complaint  Patient presents with   STD testing    HPI:      Alyssa Duffy is a 27 y.o. G1P1001 whose LMP was Patient's last menstrual period was 09/30/2022 (exact date)., presents today for STD testing to be safe. No sx, no known exposures. No new partners.  Pt is on Lo Loestrin OCPs. Skips periods some months, other times with BTB, no late/missed pills. Sx for many months; is under increased stress with recent loss of someone close. Pt having hot flashes with FH early menopause. Pt did have hot flashes with n/v and seizure-like sx per pt. Having increased anxiety due to loss of loved one as well. On effexor but has been skipping doses.    Patient Active Problem List   Diagnosis Date Noted   Anxiety 10/06/2022   Postpartum care following vaginal delivery 06/04/2018   BMI 30.0-30.9,adult 10/04/2017    Past Surgical History:  Procedure Laterality Date   DENTAL SURGERY      Family History  Problem Relation Age of Onset   Diabetes Mellitus II Maternal Aunt     Social History   Socioeconomic History   Marital status: Significant Other    Spouse name: Lurena Joiner   Number of children: Not on file   Years of education: Not on file   Highest education level: Not on file  Occupational History   Occupation: [employer]  Tobacco Use   Smoking status: Never   Smokeless tobacco: Never  Vaping Use   Vaping Use: Some days  Substance and Sexual Activity   Alcohol use: Yes    Comment: rare   Drug use: No   Sexual activity: Yes    Birth control/protection: Pill, Condom  Other Topics Concern   Not on file  Social History Narrative   Not on file   Social Determinants of Health   Financial Resource Strain: Not on file  Food Insecurity: Not on file  Transportation Needs: Not on file  Physical Activity: Not on file  Stress: Not on file  Social Connections: Not on file  Intimate Partner Violence: Not on file    Outpatient Medications Prior to  Visit  Medication Sig Dispense Refill   venlafaxine XR (EFFEXOR-XR) 150 MG 24 hr capsule Take 1 capsule (150 mg total) by mouth daily. 90 capsule 2   Norethindrone-Ethinyl Estradiol-Fe Biphas (LO LOESTRIN FE) 1 MG-10 MCG / 10 MCG tablet Take 1 tablet by mouth daily. 84 tablet 3   No facility-administered medications prior to visit.      ROS:  Review of Systems  Constitutional:  Negative for fever.  Gastrointestinal:  Positive for nausea. Negative for blood in stool, constipation, diarrhea and vomiting.  Endocrine: Positive for heat intolerance.  Genitourinary:  Positive for vaginal bleeding. Negative for dyspareunia, dysuria, flank pain, frequency, hematuria, urgency, vaginal discharge and vaginal pain.  Musculoskeletal:  Negative for back pain.  Skin:  Negative for rash.  Psychiatric/Behavioral:  Positive for agitation and dysphoric mood.    BREAST: No symptoms   OBJECTIVE:   Vitals:  BP 102/60   Ht '4\' 11"'$  (1.499 m)   Wt 145 lb (65.8 kg)   LMP 09/30/2022 (Exact Date)   BMI 29.29 kg/m   Physical Exam Vitals reviewed.  Constitutional:      Appearance: She is well-developed.  Pulmonary:     Effort: Pulmonary effort is normal.  Genitourinary:    General: Normal vulva.     Pubic  Area: No rash.      Labia:        Right: No rash, tenderness or lesion.        Left: No rash, tenderness or lesion.      Vagina: Bleeding present. No vaginal discharge, erythema or tenderness.     Cervix: Normal.     Uterus: Normal. Not enlarged and not tender.      Adnexa: Right adnexa normal and left adnexa normal.       Right: No mass or tenderness.         Left: No mass or tenderness.    Musculoskeletal:        General: Normal range of motion.     Cervical back: Normal range of motion.  Skin:    General: Skin is warm and dry.  Neurological:     General: No focal deficit present.     Mental Status: She is alert and oriented to person, place, and time.  Psychiatric:        Mood and  Affect: Mood normal.        Behavior: Behavior normal.        Thought Content: Thought content normal.        Judgment: Judgment normal.    Assessment/Plan: Screening for STD (sexually transmitted disease) - Plan: HEP, RPR, HIV Panel, Cervicovaginal ancillary only; labs today. Will f/u if abn.   Breakthrough bleeding on OCPs - Plan: drospirenone-ethinyl estradiol (YAZ) 3-0.02 MG tablet; rule out STDs, change OCPs. F/u at 7/24 annual/sooner prn.   Hot flashes--most likely due to stress given recent loss; also on hormones so most likely not perimenopausal. If sx coincide with n/v/syncope vs seizure then that isn't normal and pt needs to f/u with PCP or seek emergency care.  Anxiety--make sure to take effexor QD.    Meds ordered this encounter  Medications   drospirenone-ethinyl estradiol (YAZ) 3-0.02 MG tablet    Sig: Take 1 tablet by mouth daily.    Dispense:  84 tablet    Refill:  0    Order Specific Question:   Supervising Provider    Answer:   Rubie Maid [id number]      Return in about 4 months (around 02/05/2023), or if symptoms worsen or fail to improve, for annual.  Iyana Topor B. Courtney Fenlon, PA-C 10/06/2022 4:35 PM

## 2022-10-07 LAB — HEP, RPR, HIV PANEL
HIV Screen 4th Generation wRfx: NONREACTIVE
Hepatitis B Surface Ag: NEGATIVE
RPR Ser Ql: NONREACTIVE

## 2022-10-08 LAB — CERVICOVAGINAL ANCILLARY ONLY
Chlamydia: NEGATIVE
Comment: NEGATIVE
Comment: NEGATIVE
Comment: NORMAL
Neisseria Gonorrhea: NEGATIVE
Trichomonas: NEGATIVE

## 2022-12-28 ENCOUNTER — Other Ambulatory Visit: Payer: Self-pay | Admitting: Obstetrics and Gynecology

## 2022-12-28 ENCOUNTER — Encounter: Payer: Self-pay | Admitting: Obstetrics and Gynecology

## 2022-12-28 DIAGNOSIS — F32A Depression, unspecified: Secondary | ICD-10-CM

## 2022-12-28 DIAGNOSIS — N921 Excessive and frequent menstruation with irregular cycle: Secondary | ICD-10-CM

## 2022-12-28 MED ORDER — VENLAFAXINE HCL ER 150 MG PO CP24: 150.0000 mg | ORAL_CAPSULE | Freq: Every day | ORAL | 2 refills | Status: DC

## 2022-12-28 NOTE — Progress Notes (Signed)
Rx RF effexor 150 mg till 7/24 annual

## 2022-12-29 NOTE — Telephone Encounter (Signed)
2 refills cancelled.

## 2022-12-30 ENCOUNTER — Encounter: Payer: Self-pay | Admitting: Obstetrics and Gynecology

## 2022-12-31 ENCOUNTER — Other Ambulatory Visit: Payer: Self-pay

## 2022-12-31 DIAGNOSIS — N921 Excessive and frequent menstruation with irregular cycle: Secondary | ICD-10-CM

## 2022-12-31 MED ORDER — DROSPIRENONE-ETHINYL ESTRADIOL 3-0.02 MG PO TABS: 1.0000 | ORAL_TABLET | Freq: Every day | ORAL | 0 refills | Status: DC

## 2023-03-07 NOTE — Progress Notes (Unsigned)
PCP:  Patient, No Pcp Per   No chief complaint on file.    HPI:      [name] is a 27 y.o. G1P1001 whose LMP was No LMP recorded. (Menstrual status: Oral contraceptives)., presents today for her annual examination.  Her menses are regular every 28-30 days, lasting 2-8 days, light flow, on OCPs.  Dysmenorrhea mild, improved since pregnancy and on OCPs. She does not have intermenstrual bleeding.  Sex activity: single partner, contraception - OCP (estrogen/progesterone). No pain/bleeding. Last Pap: 11/28/20 Results were: no abnormalities ; no hx of abn paps  There is no FH of breast cancer. There is no FH of ovarian cancer. The patient does do self-breast exams.  Tobacco use: smokes cigs or vapes sometimes; done more with anxiety, not daily Alcohol use: none No drug use.  Exercise: moderately active  She does get adequate calcium but not Vitamin D in her diet.  Pt with issues of anxiety/depression for many yrs, worse PP till now. Feels like she's had depression since age 27.  Still having sx, some days worse than others. Does have SI regularly but wouldn't carry it out. Was started on lexapro in the past which increased SI, so pt told to stop it by Dr. Tiburcio Pea. Hasn't done meds since. Having panic attacks at times; still with SI but states wouldn't do it. Hasn't seen therapist. Strong FH anxiety and depression.   Also with long hx of urinary frequency/urgency with and without good flow. Drinks caffeine and a good bit of water. Sx worse recently but denies dysuria, hematuria, LBP.   Patient Active Problem List   Diagnosis Date Noted   Anxiety 10/06/2022   Postpartum care following vaginal delivery 06/04/2018   BMI 30.0-30.9,adult 10/04/2017    Past Surgical History:  Procedure Laterality Date   DENTAL SURGERY      Family History  Problem Relation Age of Onset   Diabetes Mellitus II Maternal Aunt     Social History   Socioeconomic History   Marital status:  Significant Other    Spouse name: Franky Macho   Number of children: Not on file   Years of education: Not on file   Highest education level: Not on file  Occupational History   Occupation: [employer]  Tobacco Use   Smoking status: Never   Smokeless tobacco: Never  Vaping Use   Vaping status: Some Days  Substance and Sexual Activity   Alcohol use: Yes    Comment: rare   Drug use: No   Sexual activity: Yes    Birth control/protection: Pill, Condom  Other Topics Concern   Not on file  Social History Narrative   Not on file   Social Determinants of Health   Financial Resource Strain: Not on file  Food Insecurity: Not on file  Transportation Needs: Not on file  Physical Activity: Not on file  Stress: Not on file  Social Connections: Not on file  Intimate Partner Violence: Not on file     Current Outpatient Medications:    drospirenone-ethinyl estradiol (YAZ) 3-0.02 MG tablet, Take 1 tablet by mouth daily., Disp: 84 tablet, Rfl: 0   venlafaxine XR (EFFEXOR-XR) 150 MG 24 hr capsule, Take 1 capsule (150 mg total) by mouth daily., Disp: 90 capsule, Rfl: 2     ROS:  Review of Systems  Constitutional:  Positive for fatigue. Negative for fever and unexpected weight change.  Respiratory:  Negative for cough, shortness of breath and wheezing.   Cardiovascular:  Negative  for chest pain, palpitations and leg swelling.  Gastrointestinal:  Negative for blood in stool, constipation, diarrhea, nausea and vomiting.  Endocrine: Negative for cold intolerance, heat intolerance and polyuria.  Genitourinary:  Positive for frequency and urgency. Negative for dyspareunia, dysuria, flank pain, genital sores, hematuria, menstrual problem, pelvic pain, vaginal bleeding, vaginal discharge and vaginal pain.  Musculoskeletal:  Negative for back pain, joint swelling and myalgias.  Skin:  Negative for rash.  Neurological:  Positive for headaches. Negative for dizziness, syncope, light-headedness and numbness.   Hematological:  Negative for adenopathy.  Psychiatric/Behavioral:  Positive for agitation and dysphoric mood. Negative for confusion, sleep disturbance and suicidal ideas. The patient is not nervous/anxious.    BREAST: No symptoms   Objective: There were no vitals taken for this visit.   Physical Exam Constitutional:      Appearance: She is well-developed.  Genitourinary:     Vulva normal.     Right Labia: No rash, tenderness or lesions.    Left Labia: No tenderness, lesions or rash.    No vaginal discharge, erythema or tenderness.      Right Adnexa: not tender and no mass present.    Left Adnexa: not tender and no mass present.    No cervical friability or polyp.     Uterus is not enlarged or tender.  Breasts:    Right: No mass, nipple discharge, skin change or tenderness.     Left: No mass, nipple discharge, skin change or tenderness.  Neck:     Thyroid: No thyromegaly.  Cardiovascular:     Rate and Rhythm: Normal rate and regular rhythm.     Heart sounds: Normal heart sounds. No murmur heard. Pulmonary:     Effort: Pulmonary effort is normal.     Breath sounds: Normal breath sounds.  Abdominal:     Palpations: Abdomen is soft.     Tenderness: There is no abdominal tenderness. There is no guarding or rebound.  Musculoskeletal:        General: Normal range of motion.     Cervical back: Normal range of motion.  Lymphadenopathy:     Cervical: No cervical adenopathy.  Neurological:     General: No focal deficit present.     Mental Status: She is alert and oriented to person, place, and time.     Cranial Nerves: No cranial nerve deficit.  Skin:    General: Skin is warm and dry.  Psychiatric:        Mood and Affect: Mood normal.        Behavior: Behavior normal.        Thought Content: Thought content normal.        Judgment: Judgment normal.  Vitals reviewed.     Results: No results found for this or any previous visit (from the past 24 hour(s)).       03/31/2022    9:23 AM 01/26/2022    2:04 PM  GAD 7 : Generalized Anxiety Score  Nervous, Anxious, on Edge 3 2  Control/stop worrying 2 2  Worry too much - different things 2 3  Trouble relaxing 1 1  Restless 2 2  Easily annoyed or irritable 3 3  Afraid - awful might happen 2 3  Total GAD 7 Score 15 16  Anxiety Difficulty Somewhat difficult Somewhat difficult       03/31/2022    9:23 AM  Depression screen PHQ 2/9  Decreased Interest 1  Down, Depressed, Hopeless 2  PHQ - 2 Score  3  Altered sleeping 2  Change in appetite 1  Feeling bad or failure about yourself  2  Trouble concentrating 0  Moving slowly or fidgety/restless 0  Suicidal thoughts 1  PHQ-9 Score 9  Difficult doing work/chores Somewhat difficult     Assessment/Plan: Encounter for annual routine gynecological examination  Encounter for surveillance of contraceptive pills - Plan: Norethindrone-Ethinyl Estradiol-Fe Biphas (LO LOESTRIN FE) 1 MG-10 MCG / 10 MCG tablet; OCP RF  Urinary frequency - Plan: POCT Urinalysis Dipstick, Urine Culture; pos sx and mildly positive UA, check C&S. Will f/u with results if positive. Increase water, d/c caffeine.   Anxiety and depression - Plan: venlafaxine XR (EFFEXOR XR) 37.5 MG 24 hr capsule, venlafaxine XR (EFFEXOR-XR) 75 MG 24 hr capsule, Ambulatory referral to Psychiatry; discussed tx and referral to therapist. Rx effexor, wean on. Increase exercise. RTO in 6 wks for f/u/sooner prn. Pt knows to call 911 if suicidal and has friend she promises to reach out to if sx occur. Aware to d/c effexor if SI sx worsen.   No orders of the defined types were placed in this encounter.            GYN counsel adequate intake of calcium and vitamin D, diet and exercise     F/U  No follow-ups on file.  Wetona Viramontes B. Janita Camberos, PA-C 03/07/2023 8:55 PM

## 2023-03-08 ENCOUNTER — Encounter: Payer: Self-pay | Admitting: Obstetrics and Gynecology

## 2023-03-08 ENCOUNTER — Other Ambulatory Visit (HOSPITAL_COMMUNITY)
Admission: RE | Admit: 2023-03-08 | Discharge: 2023-03-08 | Disposition: A | Discharge status: Home / Self Care | Payer: Medicaid Other | Source: Ambulatory Visit | Attending: Obstetrics and Gynecology | Admitting: Obstetrics and Gynecology

## 2023-03-08 ENCOUNTER — Ambulatory Visit (INDEPENDENT_AMBULATORY_CARE_PROVIDER_SITE_OTHER): Payer: Medicaid Other | Admitting: Obstetrics and Gynecology

## 2023-03-08 VITALS — BP 110/80 | Ht 59.0 in | Wt 139.0 lb

## 2023-03-08 DIAGNOSIS — Z01411 Encounter for gynecological examination (general) (routine) with abnormal findings: Secondary | ICD-10-CM | POA: Diagnosis not present

## 2023-03-08 DIAGNOSIS — N912 Amenorrhea, unspecified: Secondary | ICD-10-CM

## 2023-03-08 DIAGNOSIS — Z3202 Encounter for pregnancy test, result negative: Secondary | ICD-10-CM

## 2023-03-08 DIAGNOSIS — Z124 Encounter for screening for malignant neoplasm of cervix: Secondary | ICD-10-CM

## 2023-03-08 DIAGNOSIS — F32A Depression, unspecified: Secondary | ICD-10-CM

## 2023-03-08 DIAGNOSIS — Z01419 Encounter for gynecological examination (general) (routine) without abnormal findings: Secondary | ICD-10-CM

## 2023-03-08 DIAGNOSIS — Z3041 Encounter for surveillance of contraceptive pills: Secondary | ICD-10-CM

## 2023-03-08 DIAGNOSIS — F419 Anxiety disorder, unspecified: Secondary | ICD-10-CM

## 2023-03-08 LAB — POCT URINE PREGNANCY: Preg Test, Ur: NEGATIVE

## 2023-03-08 MED ORDER — VENLAFAXINE HCL ER 37.5 MG PO CP24: 37.5000 mg | ORAL_CAPSULE | Freq: Every day | ORAL | 0 refills | Status: DC

## 2023-03-08 MED ORDER — DROSPIRENONE-ETHINYL ESTRADIOL 3-0.02 MG PO TABS
1.0000 | ORAL_TABLET | Freq: Every day | ORAL | 3 refills | Status: DC
Start: 2023-03-08 — End: 2024-02-14

## 2023-03-08 MED ORDER — HYDROXYZINE HCL 25 MG PO TABS: 25.0000 mg | ORAL_TABLET | Freq: Four times a day (QID) | ORAL | 0 refills | Status: DC | PRN

## 2023-03-08 NOTE — Patient Instructions (Signed)
I value your feedback and you entrusting us with your care. If you get a [facility] patient survey, I would appreciate you taking the time to let us know about your experience today. Thank you! ? ? ?

## 2023-04-01 ENCOUNTER — Encounter: Payer: Self-pay | Admitting: Obstetrics and Gynecology

## 2023-04-01 DIAGNOSIS — F419 Anxiety disorder, unspecified: Secondary | ICD-10-CM

## 2023-04-02 ENCOUNTER — Other Ambulatory Visit: Payer: Self-pay | Admitting: Obstetrics and Gynecology

## 2023-04-02 DIAGNOSIS — F419 Anxiety disorder, unspecified: Secondary | ICD-10-CM

## 2023-04-02 MED ORDER — LORAZEPAM 0.5 MG PO TABS
0.5000 mg | ORAL_TABLET | Freq: Three times a day (TID) | ORAL | 0 refills | Status: AC | PRN
Start: 2023-04-02 — End: ?

## 2023-04-02 MED ORDER — VENLAFAXINE HCL ER 150 MG PO CP24
300.0000 mg | ORAL_CAPSULE | Freq: Every day | ORAL | 0 refills | Status: DC
Start: 2023-04-02 — End: 2023-05-03

## 2023-04-02 NOTE — Progress Notes (Signed)
No real sx improvement with 187.5 mg effexor dose. GAD=15, PHQ=20. 1 point higher on each from 03/10/23 appt. Pt doesn't feel any difference in sx, no side effects. Hasn't seen therapist yet due to moving. Increase effexor to 300 mg dose daily, Rx ativan to take sparingly since atarax not helpful. Pt to f/u with sx in 4 wks/sooner prn.

## 2023-04-02 NOTE — Telephone Encounter (Signed)
Pls call pt to do GAD/PHQ. Thx.

## 2023-04-05 NOTE — Telephone Encounter (Signed)
Pls call pharm. Venlaxafine ER eRxd. Don't understand why their request to change since it's listed as preferred and pt has bene on same Rx whole time?

## 2023-04-05 NOTE — Telephone Encounter (Signed)
Called pharmacy and spoke to Tatum. I was advised by him to disregard this fax. Pt picked up this Rx on 9/6 at 3:00 pm. On the 7th pt tried to refill the 37.5 mg dose and that one was denied because she had already filled this one.

## 2023-04-26 ENCOUNTER — Other Ambulatory Visit: Payer: Self-pay | Admitting: Obstetrics and Gynecology

## 2023-04-26 ENCOUNTER — Encounter: Payer: Self-pay | Admitting: Obstetrics and Gynecology

## 2023-04-26 DIAGNOSIS — F419 Anxiety disorder, unspecified: Secondary | ICD-10-CM

## 2023-04-27 NOTE — Telephone Encounter (Signed)
Pls call for GAD/PHQ. Thx.

## 2023-04-27 NOTE — Telephone Encounter (Signed)
Called pt, no answer, LVMTRC. 

## 2023-04-29 NOTE — Telephone Encounter (Signed)
GAD=11, PHQ=11.

## 2023-05-03 ENCOUNTER — Other Ambulatory Visit: Payer: Self-pay | Admitting: Obstetrics and Gynecology

## 2023-05-03 DIAGNOSIS — F32A Depression, unspecified: Secondary | ICD-10-CM

## 2023-05-03 MED ORDER — VENLAFAXINE HCL ER 150 MG PO CP24
300.0000 mg | ORAL_CAPSULE | Freq: Every day | ORAL | 2 refills | Status: DC
Start: 2023-05-03 — End: 2023-05-04

## 2023-05-03 NOTE — Telephone Encounter (Signed)
Pls call pharmacy to clarify. Looks like the [employer] is covered but not the generic venlafaxine?

## 2023-05-04 ENCOUNTER — Other Ambulatory Visit: Payer: Self-pay | Admitting: Obstetrics and Gynecology

## 2023-05-04 MED ORDER — VENLAFAXINE HCL ER 150 MG PO TB24: 300.0000 mg | ORAL_TABLET | Freq: Every day | ORAL | 2 refills | Status: DC

## 2023-05-04 NOTE — Telephone Encounter (Signed)
Can you ask pharm if there is a different way to prescribe the 300 mg dose? That is crazy it needs PA for that dose. Thx.

## 2023-05-04 NOTE — Telephone Encounter (Addendum)
Insurance plan benefit limit on quantity and day supply is in place. The patient might be trying to refill too early or the provider wrote a script for a longer period than the insurance plan provides.

## 2023-05-04 NOTE — Progress Notes (Signed)
Rx effexor XR TAB form to be covered by insurance (caps not covered at dose)

## 2023-05-28 ENCOUNTER — Encounter: Payer: Self-pay | Admitting: Obstetrics and Gynecology

## 2023-05-28 ENCOUNTER — Other Ambulatory Visit: Payer: Self-pay | Admitting: Obstetrics and Gynecology

## 2023-05-28 DIAGNOSIS — F419 Anxiety disorder, unspecified: Secondary | ICD-10-CM

## 2023-05-28 DIAGNOSIS — F32A Depression, unspecified: Secondary | ICD-10-CM

## 2023-05-31 ENCOUNTER — Other Ambulatory Visit: Payer: Self-pay | Admitting: Obstetrics and Gynecology

## 2023-05-31 DIAGNOSIS — F32A Depression, unspecified: Secondary | ICD-10-CM

## 2023-05-31 MED ORDER — LORAZEPAM 0.5 MG PO TABS: 0.5000 mg | ORAL_TABLET | Freq: Three times a day (TID) | ORAL | 0 refills | Status: DC | PRN

## 2023-05-31 NOTE — Progress Notes (Signed)
Rx RF ativan to take sparingly ,on effexor. Having panic attacks.

## 2023-07-25 ENCOUNTER — Other Ambulatory Visit: Payer: Self-pay | Admitting: Obstetrics and Gynecology

## 2023-07-25 DIAGNOSIS — F32A Depression, unspecified: Secondary | ICD-10-CM

## 2023-07-29 ENCOUNTER — Encounter: Payer: Self-pay | Admitting: Obstetrics and Gynecology

## 2023-07-29 ENCOUNTER — Telehealth: Payer: Self-pay

## 2023-07-29 NOTE — Telephone Encounter (Signed)
 Spoke with pharmacy. Advised this happens each time patient requests refills. Per previous phone encounter:  Placed on hold for 30 minutes.

## 2023-07-29 NOTE — Telephone Encounter (Signed)
 Spoke with [facility] who states this keeps getting rejected due to patient putting this medication on automatic refill. This medication can not be on automatic refill. Advised rx is being filled and should be ready for pick up by 11 am 07/31/23.

## 2023-07-29 NOTE — Telephone Encounter (Signed)
 This happens each time she gets refills. Pt is on the generic alternative so not sure why this keeps happening. Pls get clarification. Thx.

## 2023-07-29 NOTE — Telephone Encounter (Signed)
 Spoke with [facility] placed on hold advised they will transfer me to the dispensing pharmacy. On hold again for 20 minutes.

## 2023-08-01 ENCOUNTER — Encounter: Payer: Self-pay | Admitting: Obstetrics and Gynecology

## 2023-08-11 ENCOUNTER — Encounter: Payer: Self-pay | Admitting: Obstetrics and Gynecology

## 2023-09-23 ENCOUNTER — Encounter: Payer: Self-pay | Admitting: Obstetrics and Gynecology

## 2023-09-27 ENCOUNTER — Encounter: Payer: Self-pay | Admitting: Obstetrics and Gynecology

## 2023-09-27 NOTE — Telephone Encounter (Signed)
 Letter f/u

## 2023-10-30 ENCOUNTER — Encounter: Payer: Self-pay | Admitting: Obstetrics and Gynecology

## 2023-10-30 DIAGNOSIS — F419 Anxiety disorder, unspecified: Secondary | ICD-10-CM

## 2023-11-01 MED ORDER — BUSPIRONE HCL 7.5 MG PO TABS: 7.5000 mg | ORAL_TABLET | Freq: Two times a day (BID) | ORAL | 1 refills | Status: DC

## 2024-01-05 ENCOUNTER — Encounter: Payer: Self-pay | Admitting: Obstetrics and Gynecology

## 2024-01-06 ENCOUNTER — Other Ambulatory Visit: Payer: Self-pay | Admitting: Obstetrics and Gynecology

## 2024-01-06 MED ORDER — VENLAFAXINE BESYLATE ER 112.5 MG PO TB24: 225.0000 mg | ORAL_TABLET | Freq: Every day | ORAL | 0 refills | Status: DC

## 2024-01-06 NOTE — Progress Notes (Signed)
 Rx venlafaxine  changed to 2-112 mg caps due to size of 150 mg dose

## 2024-01-07 ENCOUNTER — Telehealth: Payer: Self-pay

## 2024-01-07 NOTE — Telephone Encounter (Signed)
 Messaged Alyssa Duffy for alternative.

## 2024-01-08 ENCOUNTER — Other Ambulatory Visit: Payer: Self-pay | Admitting: Obstetrics and Gynecology

## 2024-01-08 DIAGNOSIS — Z3041 Encounter for surveillance of contraceptive pills: Secondary | ICD-10-CM

## 2024-01-09 NOTE — Telephone Encounter (Signed)
 Pls look into this for pt. Sent her in different Rx due to size of pills but they were out of stock. Please price the 75 mg capsule as well in case she needs to take multiples of those. Shanora Christensen

## 2024-01-10 ENCOUNTER — Encounter: Payer: Self-pay | Admitting: Obstetrics and Gynecology

## 2024-01-10 NOTE — Telephone Encounter (Signed)
 So, we're all good for this then or do I need to send in Rx again? Thx.

## 2024-01-10 NOTE — Telephone Encounter (Signed)
 PA submitted.

## 2024-01-11 NOTE — Telephone Encounter (Signed)
 PA approved.

## 2024-02-10 ENCOUNTER — Other Ambulatory Visit: Payer: Self-pay | Admitting: Obstetrics and Gynecology

## 2024-02-10 DIAGNOSIS — Z3041 Encounter for surveillance of contraceptive pills: Secondary | ICD-10-CM

## 2024-03-12 NOTE — Progress Notes (Unsigned)
 PCP:  Patient, No Pcp Per   No chief complaint on file.    HPI:      [name] is a 28 y.o. G1P1001 whose LMP was No LMP recorded. (Menstrual status: Oral contraceptives)., presents today for her annual examination.  Her menses are usually regular every 28-30 days, lasting 4-6 days, sometimes 7-8 days, light flow, on OCPs.  Dysmenorrhea none, improved since pregnancy and on OCPs. She does not have intermenstrual bleeding. Has not had a period since 3/24 except occas spotting for a day, not on placebo pills; has been under significant stress this yr.   Sex activity: single partner, contraception - OCP (estrogen/progesterone). No pain/bleeding. Last Pap: 03/08/23  Results were: no abnormalities ; no hx of abn paps  There is no FH of breast cancer. There is no FH of ovarian cancer. The patient does do self-breast exams.  Tobacco use: vapes daily Alcohol use: none No drug use.  Exercise: min active now due to broken foot  She does get adequate calcium and Vitamin D in her diet.  Pt with issues of anxiety/depression for many yrs, worse PP till now. Started on effexor  last yr; now up to 150 mg daily. Had been under control until under increased stress this yr with loss of friend, getting ready to move, etc. Not seeing therapist currently although has reached out to [facility] to get therapist appt.  Back to having panic attacks again; severe anxiety sx 4-5 times weekly. Feels like she's had depression since age 31.  Does have SI regularly but wouldn't carry it out. Was started on lexapro  in the past which increased SI, so pt told to stop it by Dr. Arloa. Strong FH anxiety and depression.   On effexor  224 mg daily now  Patient Active Problem List   Diagnosis Date Noted   Anxiety and depression 10/06/2022   Postpartum care following vaginal delivery 06/04/2018   BMI 30.0-30.9,adult 10/04/2017    Past Surgical History:  Procedure Laterality Date   DENTAL SURGERY      Family  History  Problem Relation Age of Onset   Diabetes Mellitus II Maternal Aunt     Social History   Socioeconomic History   Marital status: Significant Other    Spouse name: Herlene   Number of children: Not on file   Years of education: Not on file   Highest education level: Not on file  Occupational History   Occupation: [employer]  Tobacco Use   Smoking status: Never   Smokeless tobacco: Never  Vaping Use   Vaping status: Every Day  Substance and Sexual Activity   Alcohol use: Not Currently   Drug use: No   Sexual activity: Yes    Birth control/protection: Pill  Other Topics Concern   Not on file  Social History Narrative   Not on file   Social Drivers of Health   Financial Resource Strain: Not on file  Food Insecurity: Not on file  Transportation Needs: Not on file  Physical Activity: Not on file  Stress: Not on file  Social Connections: Not on file  Intimate Partner Violence: Not on file     Current Outpatient Medications:    busPIRone  (BUSPAR ) 7.5 MG tablet, Take 1 tablet (7.5 mg total) by mouth 2 (two) times daily., Disp: 60 tablet, Rfl: 1   drospirenone -ethinyl estradiol  (YAZ) 3-0.02 MG tablet, TAKE 1 TABLET BY MOUTH DAILY, Disp: 84 tablet, Rfl: 0   LORazepam  (ATIVAN ) 0.5 MG tablet, TAKE 1 TABLET(0.5  MG) BY MOUTH EVERY 8 HOURS AS NEEDED FOR ANXIETY, Disp: 30 tablet, Rfl: 0   Venlafaxine  Besylate ER 112.5 MG TB24, Take 225 mg by mouth daily., Disp: 180 tablet, Rfl: 0     ROS:  Review of Systems  Constitutional:  Positive for fatigue. Negative for fever and unexpected weight change.  Respiratory:  Positive for shortness of breath. Negative for cough and wheezing.   Cardiovascular:  Negative for chest pain, palpitations and leg swelling.  Gastrointestinal:  Positive for nausea. Negative for blood in stool, constipation, diarrhea and vomiting.  Endocrine: Negative for cold intolerance, heat intolerance and polyuria.  Genitourinary:  Positive for frequency and  urgency. Negative for dyspareunia, dysuria, flank pain, genital sores, hematuria, menstrual problem, pelvic pain, vaginal bleeding, vaginal discharge and vaginal pain.  Musculoskeletal:  Positive for arthralgias. Negative for back pain, joint swelling and myalgias.  Skin:  Negative for rash.  Neurological:  Positive for dizziness and headaches. Negative for syncope, light-headedness and numbness.  Hematological:  Negative for adenopathy.  Psychiatric/Behavioral:  Positive for agitation and dysphoric mood. Negative for confusion, sleep disturbance and suicidal ideas. The patient is not nervous/anxious.    BREAST: No symptoms   Objective: There were no vitals taken for this visit.   Physical Exam Constitutional:      Appearance: She is well-developed.  Genitourinary:     Vulva normal.     Right Labia: No rash, tenderness or lesions.    Left Labia: No tenderness, lesions or rash.    No vaginal discharge, erythema or tenderness.      Right Adnexa: not tender and no mass present.    Left Adnexa: not tender and no mass present.    No cervical friability or polyp.     Uterus is not enlarged or tender.  Breasts:    Right: No mass, nipple discharge, skin change or tenderness.     Left: No mass, nipple discharge, skin change or tenderness.  Neck:     Thyroid: No thyromegaly.  Cardiovascular:     Rate and Rhythm: Normal rate and regular rhythm.     Heart sounds: Normal heart sounds. No murmur heard. Pulmonary:     Effort: Pulmonary effort is normal.     Breath sounds: Normal breath sounds.  Abdominal:     Palpations: Abdomen is soft.     Tenderness: There is no abdominal tenderness. There is no guarding or rebound.  Musculoskeletal:        General: Normal range of motion.     Cervical back: Normal range of motion.  Lymphadenopathy:     Cervical: No cervical adenopathy.  Neurological:     General: No focal deficit present.     Mental Status: She is alert and oriented to person,  place, and time.     Cranial Nerves: No cranial nerve deficit.  Skin:    General: Skin is warm and dry.  Psychiatric:        Mood and Affect: Mood normal.        Behavior: Behavior normal.        Thought Content: Thought content normal.        Judgment: Judgment normal.  Vitals reviewed.     Results:  No results found for this or any previous visit (from the past 24 hours).      03/08/2023   10:35 AM 03/31/2022    9:23 AM 01/26/2022    2:04 PM  GAD 7 : Generalized Anxiety Score  Nervous, Anxious, on  Edge 3 3 2   Control/stop worrying 3 2 2   Worry too much - different things 3 2 3   Trouble relaxing 2 1 1   Restless 2 2 2   Easily annoyed or irritable 3 3 3   Afraid - awful might happen 3 2 3   Total GAD 7 Score 19 15 16   Anxiety Difficulty Very difficult Somewhat difficult Somewhat difficult       03/08/2023   10:34 AM  Depression screen PHQ 2/9  Decreased Interest 1  Down, Depressed, Hopeless 2  PHQ - 2 Score 3  Altered sleeping 3  Tired, decreased energy 2  Change in appetite 0  Feeling bad or failure about yourself  2  Trouble concentrating 1  Moving slowly or fidgety/restless 1  Suicidal thoughts 2  PHQ-9 Score 14  Difficult doing work/chores Somewhat difficult     Assessment/Plan: Encounter for annual routine gynecological examination  Cervical cancer screening - Plan: Cytology - PAP  Encounter for surveillance of contraceptive pills - Plan: drospirenone -ethinyl estradiol  (YAZ) 3-0.02 MG tablet; OCP RF. Neg UPT, Reassurance. Amenorrhea most likely due to stress. F/u prn.   Amenorrhea - Plan: POCT urine pregnancy; on OCPs, neg UPT, under stress. F/u prn.   Anxiety and depression - Plan: venlafaxine  XR (EFFEXOR  XR) 37.5 MG 24 hr capsule, hydrOXYzine  (ATARAX ) 25 MG tablet; sx increase with recent stressors. Increase effexor  to 187.5 mg dose (cont 150 mg, add 37.5 mg), Rx atarax  prn anxiety/panic attacks. Recommended therapist. If [facility] doesn't work, will recommend  another provider. Pt to f/u with me in 4 wks via MyChart re: anxiety/depression sx on current med change/sooner prn. Pt knows to call 911 if suicidal and has friend she promises to reach out to if sx occur.   No orders of the defined types were placed in this encounter.            GYN counsel adequate intake of calcium and vitamin D, diet and exercise     F/U  No follow-ups on file.  Deshia Vanderhoof B. Londyn Wotton, PA-C 03/12/2024 6:03 PM

## 2024-03-13 ENCOUNTER — Encounter: Payer: Self-pay | Admitting: Obstetrics and Gynecology

## 2024-03-13 ENCOUNTER — Ambulatory Visit (INDEPENDENT_AMBULATORY_CARE_PROVIDER_SITE_OTHER): Admitting: Obstetrics and Gynecology

## 2024-03-13 ENCOUNTER — Other Ambulatory Visit (HOSPITAL_COMMUNITY)
Admission: RE | Admit: 2024-03-13 | Discharge: 2024-03-13 | Disposition: A | Discharge status: Home / Self Care | Source: Ambulatory Visit | Attending: Obstetrics and Gynecology | Admitting: Obstetrics and Gynecology

## 2024-03-13 VITALS — BP 97/63 | HR 85 | Ht 59.0 in | Wt 154.0 lb

## 2024-03-13 DIAGNOSIS — F419 Anxiety disorder, unspecified: Secondary | ICD-10-CM

## 2024-03-13 DIAGNOSIS — Z124 Encounter for screening for malignant neoplasm of cervix: Secondary | ICD-10-CM

## 2024-03-13 DIAGNOSIS — F32A Depression, unspecified: Secondary | ICD-10-CM | POA: Diagnosis not present

## 2024-03-13 DIAGNOSIS — Z1151 Encounter for screening for human papillomavirus (HPV): Secondary | ICD-10-CM | POA: Insufficient documentation

## 2024-03-13 DIAGNOSIS — Z01419 Encounter for gynecological examination (general) (routine) without abnormal findings: Secondary | ICD-10-CM | POA: Diagnosis not present

## 2024-03-13 DIAGNOSIS — Z3041 Encounter for surveillance of contraceptive pills: Secondary | ICD-10-CM

## 2024-03-13 MED ORDER — DROSPIRENONE-ETHINYL ESTRADIOL 3-0.02 MG PO TABS
1.0000 | ORAL_TABLET | Freq: Every day | ORAL | 3 refills | Status: AC
Start: 2024-03-13 — End: ?

## 2024-03-13 MED ORDER — VENLAFAXINE BESYLATE ER 112.5 MG PO TB24
225.0000 mg | ORAL_TABLET | Freq: Every day | ORAL | 0 refills | Status: AC
Start: 2024-03-13 — End: ?

## 2024-03-13 MED ORDER — BUSPIRONE HCL 7.5 MG PO TABS
7.5000 mg | ORAL_TABLET | Freq: Two times a day (BID) | ORAL | 0 refills | Status: AC
Start: 2024-03-13 — End: ?

## 2024-03-13 NOTE — Patient Instructions (Signed)
 I value your feedback and you entrusting [place] with your care. If you get a [facility] patient survey, I would appreciate you taking the time to let us know about your experience today. Thank you! ? ? ?

## 2024-03-16 ENCOUNTER — Encounter: Payer: Self-pay | Admitting: Obstetrics and Gynecology

## 2024-03-18 ENCOUNTER — Other Ambulatory Visit: Payer: Self-pay | Admitting: Obstetrics and Gynecology

## 2024-03-18 DIAGNOSIS — Z3041 Encounter for surveillance of contraceptive pills: Secondary | ICD-10-CM

## 2024-03-21 ENCOUNTER — Ambulatory Visit: Payer: Self-pay | Admitting: Obstetrics and Gynecology

## 2024-03-21 LAB — CYTOLOGY - PAP
Comment: NEGATIVE
Diagnosis: UNDETERMINED — AB
High risk HPV: NEGATIVE

## 2024-03-29 ENCOUNTER — Encounter: Payer: Self-pay | Admitting: Obstetrics and Gynecology

## 2024-04-13 NOTE — Addendum Note (Signed)
 Addended by: WATT HILA B on: 04/13/2024 11:48 AM   Modules accepted: Orders
